# Patient Record
Sex: Female | Born: 1970 | Race: Black or African American | Hispanic: No | Marital: Single | Smoking: Current every day smoker
Health system: Southern US, Community
[De-identification: ages and names within clinical notes are randomized; demographics above are authoritative.]

## PROBLEM LIST (undated history)

## (undated) DIAGNOSIS — F329 Major depressive disorder, single episode, unspecified: Secondary | ICD-10-CM

## (undated) DIAGNOSIS — M51369 Other intervertebral disc degeneration, lumbar region without mention of lumbar back pain or lower extremity pain: Secondary | ICD-10-CM

## (undated) DIAGNOSIS — G4700 Insomnia, unspecified: Secondary | ICD-10-CM

## (undated) DIAGNOSIS — I10 Essential (primary) hypertension: Secondary | ICD-10-CM

## (undated) DIAGNOSIS — M1990 Unspecified osteoarthritis, unspecified site: Secondary | ICD-10-CM

## (undated) DIAGNOSIS — M419 Scoliosis, unspecified: Secondary | ICD-10-CM

## (undated) DIAGNOSIS — F419 Anxiety disorder, unspecified: Secondary | ICD-10-CM

## (undated) DIAGNOSIS — F259 Schizoaffective disorder, unspecified: Secondary | ICD-10-CM

## (undated) DIAGNOSIS — M5136 Other intervertebral disc degeneration, lumbar region: Secondary | ICD-10-CM

## (undated) DIAGNOSIS — M5126 Other intervertebral disc displacement, lumbar region: Secondary | ICD-10-CM

## (undated) DIAGNOSIS — G629 Polyneuropathy, unspecified: Secondary | ICD-10-CM

## (undated) DIAGNOSIS — F32A Depression, unspecified: Secondary | ICD-10-CM

## (undated) HISTORY — DX: Depression, unspecified: F32.A

## (undated) HISTORY — PX: EYE SURGERY: SHX253

## (undated) HISTORY — DX: Major depressive disorder, single episode, unspecified: F32.9

## (undated) HISTORY — DX: Anxiety disorder, unspecified: F41.9

## (undated) HISTORY — DX: Schizoaffective disorder, unspecified: F25.9

## (undated) HISTORY — DX: Insomnia, unspecified: G47.00

---

## 1990-07-31 HISTORY — PX: ECTOPIC PREGNANCY SURGERY: SHX613

## 1992-07-31 HISTORY — PX: OVARIAN CYST REMOVAL: SHX89

## 1996-07-31 HISTORY — PX: KNEE ARTHROSCOPY: SHX127

## 2007-08-01 HISTORY — PX: HERNIA REPAIR: SHX51

## 2007-12-08 ENCOUNTER — Emergency Department (HOSPITAL_COMMUNITY): Admission: EM | Admit: 2007-12-08 | Discharge: 2007-12-08 | Payer: Self-pay | Admitting: Emergency Medicine

## 2015-02-02 ENCOUNTER — Encounter: Payer: Self-pay | Admitting: Emergency Medicine

## 2015-02-02 ENCOUNTER — Emergency Department
Admission: EM | Admit: 2015-02-02 | Discharge: 2015-02-02 | Disposition: A | Discharge status: Home / Self Care | Payer: Self-pay | Attending: Emergency Medicine | Admitting: Emergency Medicine

## 2015-02-02 DIAGNOSIS — Z720 Tobacco use: Secondary | ICD-10-CM | POA: Insufficient documentation

## 2015-02-02 DIAGNOSIS — Z203 Contact with and (suspected) exposure to rabies: Secondary | ICD-10-CM | POA: Insufficient documentation

## 2015-02-02 DIAGNOSIS — Z79899 Other long term (current) drug therapy: Secondary | ICD-10-CM | POA: Insufficient documentation

## 2015-02-02 DIAGNOSIS — I10 Essential (primary) hypertension: Secondary | ICD-10-CM | POA: Insufficient documentation

## 2015-02-02 DIAGNOSIS — G8929 Other chronic pain: Secondary | ICD-10-CM | POA: Insufficient documentation

## 2015-02-02 DIAGNOSIS — Y9301 Activity, walking, marching and hiking: Secondary | ICD-10-CM | POA: Insufficient documentation

## 2015-02-02 DIAGNOSIS — Z760 Encounter for issue of repeat prescription: Secondary | ICD-10-CM | POA: Insufficient documentation

## 2015-02-02 DIAGNOSIS — W540XXA Bitten by dog, initial encounter: Secondary | ICD-10-CM | POA: Insufficient documentation

## 2015-02-02 DIAGNOSIS — S31154A Open bite of abdominal wall, left lower quadrant without penetration into peritoneal cavity, initial encounter: Secondary | ICD-10-CM | POA: Insufficient documentation

## 2015-02-02 DIAGNOSIS — Y92410 Unspecified street and highway as the place of occurrence of the external cause: Secondary | ICD-10-CM | POA: Insufficient documentation

## 2015-02-02 DIAGNOSIS — M545 Low back pain: Secondary | ICD-10-CM | POA: Insufficient documentation

## 2015-02-02 DIAGNOSIS — S2195XA Open bite of unspecified part of thorax, initial encounter: Secondary | ICD-10-CM

## 2015-02-02 DIAGNOSIS — Y998 Other external cause status: Secondary | ICD-10-CM | POA: Insufficient documentation

## 2015-02-02 HISTORY — DX: Other intervertebral disc displacement, lumbar region: M51.26

## 2015-02-02 HISTORY — DX: Essential (primary) hypertension: I10

## 2015-02-02 HISTORY — DX: Scoliosis, unspecified: M41.9

## 2015-02-02 HISTORY — DX: Other intervertebral disc degeneration, lumbar region: M51.36

## 2015-02-02 HISTORY — DX: Other intervertebral disc degeneration, lumbar region without mention of lumbar back pain or lower extremity pain: M51.369

## 2015-02-02 MED ORDER — AMOXICILLIN-POT CLAVULANATE 875-125 MG PO TABS
1.0000 | ORAL_TABLET | Freq: Once | ORAL | Status: AC
  Administered 2015-02-02: 1 via ORAL

## 2015-02-02 MED ORDER — ORPHENADRINE CITRATE 30 MG/ML IJ SOLN
60.0000 mg | INTRAMUSCULAR | Status: AC
  Administered 2015-02-02: 60 mg via INTRAMUSCULAR

## 2015-02-02 MED ORDER — KETOROLAC TROMETHAMINE 60 MG/2ML IM SOLN
INTRAMUSCULAR | Status: AC
  Filled 2015-02-02: qty 2

## 2015-02-02 MED ORDER — ORPHENADRINE CITRATE 30 MG/ML IJ SOLN
INTRAMUSCULAR | Status: AC
  Filled 2015-02-02: qty 2

## 2015-02-02 MED ORDER — AMOXICILLIN-POT CLAVULANATE 875-125 MG PO TABS
ORAL_TABLET | ORAL | Status: AC
  Filled 2015-02-02: qty 1

## 2015-02-02 MED ORDER — KETOROLAC TROMETHAMINE 60 MG/2ML IM SOLN
60.0000 mg | Freq: Once | INTRAMUSCULAR | Status: AC
Start: 2015-02-02 — End: 2015-02-02
  Administered 2015-02-02: 60 mg via INTRAMUSCULAR

## 2015-02-02 MED ORDER — AMOXICILLIN-POT CLAVULANATE 875-125 MG PO TABS: 1.0000 | ORAL_TABLET | Freq: Two times a day (BID) | ORAL | Status: DC

## 2015-02-02 MED ORDER — KETOROLAC TROMETHAMINE 10 MG PO TABS: 10.0000 mg | ORAL_TABLET | Freq: Three times a day (TID) | ORAL | Status: DC

## 2015-02-02 MED ORDER — ORPHENADRINE CITRATE ER 100 MG PO TB12: 100.0000 mg | ORAL_TABLET | Freq: Two times a day (BID) | ORAL | Status: DC | PRN

## 2015-02-02 NOTE — Discharge Instructions (Signed)
Chronic Back Pain  When back pain lasts longer than 3 months, it is called chronic back pain.People with chronic back pain often go through certain periods that are more intense (flare-ups).  CAUSES Chronic back pain can be caused by wear and tear (degeneration) on different structures in your back. These structures include:  The bones of your spine (vertebrae) and the joints surrounding your spinal cord and nerve roots (facets).  The strong, fibrous tissues that connect your vertebrae (ligaments). Degeneration of these structures may result in pressure on your nerves. This can lead to constant pain. HOME CARE INSTRUCTIONS  Avoid bending, heavy lifting, prolonged sitting, and activities which make the problem worse.  Take brief periods of rest throughout the day to reduce your pain. Lying down or standing usually is better than sitting while you are resting.  Take over-the-counter or prescription medicines only as directed by your caregiver. SEEK IMMEDIATE MEDICAL CARE IF:   You have weakness or numbness in one of your legs or feet.  You have trouble controlling your bladder or bowels.  You have nausea, vomiting, abdominal pain, shortness of breath, or fainting. Document Released: 08/24/2004 Document Revised: 10/09/2011 Document Reviewed: 07/01/2011 [facility] Patient Information 2015 [place]. This information is not intended to replace advice given to you by your health care provider. Make sure you discuss any questions you have with your health care provider.  Animal Bite An animal bite can result in a scratch on the skin, deep open cut, puncture of the skin, crush injury, or tearing away of the skin or a body part. Dogs are responsible for most animal bites. Children are bitten more often than adults. An animal bite can range from very mild to more serious. A small bite from your house pet is no cause for alarm. However, some animal bites can become infected or injure a bone or  other tissue. You must seek medical care if:  The skin is broken and bleeding does not slow down or stop after 15 minutes.  The puncture is deep and difficult to clean (such as a cat bite).  Pain, warmth, redness, or pus develops around the wound.  The bite is from a stray animal or rodent. There may be a risk of rabies infection.  The bite is from a snake, raccoon, skunk, fox, coyote, or bat. There may be a risk of rabies infection.  The person bitten has a chronic illness such as diabetes, liver disease, or cancer, or the person takes medicine that lowers the immune system.  There is concern about the location and severity of the bite. It is important to clean and protect an animal bite wound right away to prevent infection. Follow these steps:  Clean the wound with plenty of water and soap.  Apply an antibiotic cream.  Apply gentle pressure over the wound with a clean towel or gauze to slow or stop bleeding.  Elevate the affected area above the heart to help stop any bleeding.  Seek medical care. Getting medical care within 8 hours of the animal bite leads to the best possible outcome. DIAGNOSIS  Your caregiver will most likely:  Take a detailed history of the animal and the bite injury.  Perform a wound exam.  Take your medical history. Blood tests or X-rays may be performed. Sometimes, infected bite wounds are cultured and sent to a lab to identify the infectious bacteria.  TREATMENT  Medical treatment will depend on the location and type of animal bite as well as  the patient's medical history. Treatment may include:  Wound care, such as cleaning and flushing the wound with saline solution, bandaging, and elevating the affected area.  Antibiotics.  Tetanus immunization.  Rabies immunization.  Leaving the wound open to heal. This is often done with animal bites, due to the high risk of infection. However, in certain cases, wound closure with stitches, wound adhesive,  skin adhesive strips, or staples may be used. Infected bites that are left untreated may require intravenous (IV) antibiotics and surgical treatment in the hospital. HOME CARE INSTRUCTIONS  Follow your caregiver's instructions for wound care.  Take all medicines as directed.  If your caregiver prescribes antibiotics, take them as directed. Finish them even if you start to feel better.  Follow up with your caregiver for further exams or immunizations as directed. You may need a tetanus shot if:  You cannot remember when you had your last tetanus shot.  You have never had a tetanus shot.  The injury broke your skin. If you get a tetanus shot, your arm may swell, get red, and feel warm to the touch. This is common and not a problem. If you need a tetanus shot and you choose not to have one, there is a rare chance of getting tetanus. Sickness from tetanus can be serious. SEEK MEDICAL CARE IF:  You notice warmth, redness, soreness, swelling, pus discharge, or a bad smell coming from the wound.  You have a red line on the skin coming from the wound.  You have a fever, chills, or a general ill feeling.  You have nausea or vomiting.  You have continued or worsening pain.  You have trouble moving the injured part.  You have other questions or concerns. MAKE SURE YOU:  Understand these instructions.  Will watch your condition.  Will get help right away if you are not doing well or get worse. Document Released: 04/04/2011 Document Revised: 10/09/2011 Document Reviewed: 04/04/2011 [facility] Patient Information 2015 [place], [place]. This information is not intended to replace advice given to you by your health care provider. Make sure you discuss any questions you have with your health care provider.  Take the prescription meds as directed.  Keep the wound clean, dry, and covered.  Follow-up with your  Provider or [facility] as needed.  Rabies  Rabies is a viral infection that  can be spread to people from infected animals. The infection affects the brain and central nervous system. Once the disease develops, it almost always causes death. Because of this, when a person is bitten by an animal that may have rabies, treatment to prevent rabies often needs to be started whether or not the animal is known to be infected. Prompt treatment with the rabies vaccine and rabies immune globulin is very effective at preventing the infection from developing in people who have been exposed to the rabies virus. CAUSES  Rabies is caused by a virus that lives inside some animals. When a person is bitten by an infected animal, the rabies virus is spread to the person through the infected spit (saliva) of the animal. This virus can be carried by animals such as dogs, cats, skunks, bats, woodchucks, raccoons, coyotes, and foxes. SYMPTOMS  By the time symptoms appear, rabies is usually fatal for the person. Common symptoms include:  Headache.  Fever.  Fatigue and weakness.  Agitation.  Anxiety.  Confusion.  Unusual behavior, such as hyperactivity, fear of water (hydrophobia), or fear of air (aerophobia).  Hallucinations.  Insomnia.  Weakness in the arms or legs.  Difficulty swallowing. Most people get sick in 1-3 months after being bitten. This often varies and may depend on the location of the bite. The infection will take less time to develop if the bite occurred closer to the head.  DIAGNOSIS  To determine if a person is infected, several tests must be performed, such as:  A skin biopsy.  A saliva test.  A lumbar puncture to remove spinal fluid so it can be examined.  Blood tests. TREATMENT  Treatment to prevent the infection from developing (post-exposure prophylaxis, PEP) is often started before knowing for sure if the person has been exposed to the rabies virus. PEP involves cleaning the wound, giving an antibody injection (rabies immune globulin), and giving a  series of rabies vaccine injections. The series of injections are usually given over a two-week period. If possible, the animal that bit the person will be observed to see if it remains healthy. If the animal has been killed, it can be sent to a state laboratory and examined to see if the animal had rabies. If a person is bitten by a domestic animal (dog, cat, or ferret) that appears healthy and can be observed to see if it remains healthy, often no further treatment is necessary other than care of the wounds caused by the animal. Rabies is often a fatal illness once the infection develops in a person. Although a few people who developed rabies have survived after experimental treatment with certain drugs, all these survivors still had severe nervous system problems after the treatment. This is why caregivers use extra caution and begin PEP treatment for people who have been bitten by animals that are possibly infected with rabies.  HOME CARE INSTRUCTIONS  If you were bitten by an unknown animal, make sure you know your caregiver's instructions for follow-up. If the animal was sent to a laboratory for examination, ask when the test results will be ready. Make sure you get the test results.  Take these steps to care for your wound:  Keep the wound clean, dry, and dressed as directed by your caregiver.  Keep the injured part elevated as much as possible.  Do not resume use of the affected area until directed.  Only take over-the-counter or prescription medicines as directed by your caregiver.  Keep all follow-up appointments as directed by your caregiver. PREVENTION  To prevent rabies, people need to reduce their risk of having contact with infected animals.   Make sure your pets (dogs, cats, ferrets) are vaccinated against rabies. Keep these vaccinations up-to-date as directed by your veterinarian.  Supervise your pets when they are outside. Keep them away from wild animals.  Call your local  animal control services to report any stray animals. These animals may not be vaccinated.  Stay away from stray or wild animals.  Consider getting the rabies vaccine (preexposure) if you are traveling to an area where rabies is common or if your job or activities involve possible contact with wild or stray animals. Discuss this with your caregiver. Document Released: 07/17/2005 Document Revised: 04/10/2012 Document Reviewed: 02/13/2012 [facility] Patient Information 2015 [place], [place]. This information is not intended to replace advice given to you by your health care provider. Make sure you discuss any questions you have with your health care provider.  Follow-up with [facility] or [employer] for advise on rabies status of the stray dog.  Return to the ED in 7-10 days to start the series.  Take the antibiotic as directed.

## 2015-02-02 NOTE — ED Provider Notes (Signed)
[facility] Emergency Department Provider Note ____________________________________________  Time seen: 1545  I have reviewed the triage vital signs and the nursing notes.  HISTORY  Chief Complaint  Animal Bite and Back Pain  HPI Diamond Ballard is a 44 y.o. female to the ED for evaluation of a dog bite which was sustained on Saturday. In triage, she reported the dog involved, was known and vaccinated. In the interview, she claims that the dog was unknown, and that the bite occurred while while she waswalking on the street. She claims she bent over to pet this unknown, stray dog, when he bit her on her protuberant abdomen. She is up-to-date on her tetanus within the last 5 years. She is primarily concerned with her chronic low back pain, which was flared after she tripped walking up the stairs at home today while doing laundry. He is requesting prescription for her Percocet, which she claims she has not dosed and at least 2 months, due to relocating.  Past Medical History  Diagnosis Date  . Hypertension   . Scoliosis   . Bulging lumbar disc    There are no active problems to display for this patient.  No past surgical history on file.  Current Outpatient Rx  Name  Route  Sig  Dispense  Refill  . gabapentin (NEURONTIN) 300 MG capsule   Oral   Take 300 mg by mouth 3 (three) times daily.         Marland Kitchen. LORazepam (ATIVAN) 1 MG tablet   Oral   Take 1 mg by mouth every 8 (eight) hours.         . risperiDONE (RISPERDAL) 1 MG tablet   Oral   Take 1 mg by mouth at bedtime.         Marland Kitchen. venlafaxine (EFFEXOR) 50 MG tablet   Oral   Take 50 mg by mouth 2 (two) times daily.         Marland Kitchen. zolpidem (AMBIEN) 10 MG tablet   Oral   Take 10 mg by mouth at bedtime as needed for sleep.         Marland Kitchen. amoxicillin-clavulanate (AUGMENTIN) 875-125 MG per tablet   Oral   Take 1 tablet by mouth 2 (two) times daily.   20 tablet   0   . ketorolac (TORADOL) 10 MG tablet   Oral  Take 1 tablet (10 mg total) by mouth every 8 (eight) hours.   15 tablet   0   . orphenadrine (NORFLEX) 100 MG tablet   Oral   Take 1 tablet (100 mg total) by mouth 2 (two) times daily as needed for muscle spasms.   20 tablet   0     Allergies Trazodone and nefazodone  No family history on file.  Social History History  Substance Use Topics  . Smoking status: Current Every Day Smoker    Types: Cigarettes  . Smokeless tobacco: Not on file  . Alcohol Use: Yes   Review of Systems  Constitutional: Negative for fever. Eyes: Negative for visual changes. ENT: Negative for sore throat. Cardiovascular: Negative for chest pain. Respiratory: Negative for shortness of breath. Gastrointestinal: Negative for abdominal pain, vomiting and diarrhea. Genitourinary: Negative for dysuria. Musculoskeletal: Positive for back pain. Skin: Negative for rash. Dog bite at above. Neurological: Negative for headaches, focal weakness or numbness. ____________________________________________  PHYSICAL EXAM:  VITAL SIGNS: ED Triage Vitals  Enc Vitals Group     BP 02/02/15 1511 125/80 mmHg     Pulse  Rate 02/02/15 1511 110     Resp 02/02/15 1511 18     Temp 02/02/15 1511 99.2 F (37.3 C)     Temp Source 02/02/15 1511 Oral     SpO2 02/02/15 1511 96 %     Weight 02/02/15 1511 230 lb (104.327 kg)     Height 02/02/15 1511  (1.702 m)     Head Cir --      Peak Flow --      Pain Score 02/02/15 1513 10     Pain Loc --      Pain Edu? --      Excl. in GC? --    Constitutional: Alert and oriented. Well appearing and in no distress. Eyes: Conjunctivae are normal. PERRL. Normal extraocular movements. ENT   Head: Normocephalic and atraumatic.   Nose: No congestion/rhinnorhea.   Mouth/Throat: Mucous membranes are moist.   Neck: Supple. No thyromegaly. Hematological/Lymphatic/Immunilogical: No cervical lymphadenopathy. Cardiovascular: Normal rate, regular rhythm.  Respiratory:  Normal respiratory effort. No wheezes/rales/rhonchi. Gastrointestinal: Soft and nontender. No distention. Healing superficial scars, consistent with dog bite to the LLQ with local erythema and bruising.  Musculoskeletal: Nontender with normal range of motion in all extremities. Normal spinal alignment without spasm, deformity, or step-off.  Normal supine to sit to stand transition.   Neurologic:  Normal gait without ataxia. Normal speech and language. No gross focal neurologic deficits are appreciated. Normal LE DTRs bilaterally. Normal toe/heel raise. Negative SLR bilaterally.  Skin:  Skin is warm, dry and intact. No rash noted. Psychiatric: Mood and affect are normal. Patient exhibits appropriate insight and judgment.  PROCEDURES  Toradol 60 mg IM Norflex 60 mg IM Augmentin 875 mg PO ____________________________________________  INITIAL IMPRESSION / ASSESSMENT AND PLAN / ED COURSE  Superficial dog bite to the abdomen with local erythema. Chronic LBP with request for narcotic pain medicines. Patient advised she will not receive narcotics for her chronic pain, but will be given Toradol, Norflex, and Augmentin for her bite.   [place] PD notified of potential stray dog bite. Patient advised to follow-up regarding rabies status of dog. She is refusing rabies series at this time. Advised to return to the ED in 7-10 days for rabies vaccine and immune-globin. Rabies information given. ____________________________________________  FINAL CLINICAL IMPRESSION(S) / ED DIAGNOSES  Final diagnoses:  Dog bite of trunk, initial encounter  Chronic LBP     Lissa Hoard, PA-C 02/02/15 1632  Arnaldo Natal, MD 02/02/15 2232

## 2015-02-02 NOTE — ED Notes (Signed)
[facility]

## 2015-02-02 NOTE — ED Notes (Signed)
Pt states she was bit by her cousins dog on Sunday to abd., states dog has all of it's immunizations up to date, also states she fell this am while carrying laundry up stairs, states she has chronic lower back pain and fall made pain worse

## 2015-02-02 NOTE — ED Notes (Signed)
Pt has bruising and abrasions noted to LLQ of abd.

## 2015-07-21 ENCOUNTER — Emergency Department: Payer: Medicaid Other

## 2015-07-21 ENCOUNTER — Encounter: Payer: Self-pay | Admitting: Emergency Medicine

## 2015-07-21 ENCOUNTER — Emergency Department
Admission: EM | Admit: 2015-07-21 | Discharge: 2015-07-21 | Disposition: A | Discharge status: Home / Self Care | Payer: Medicaid Other | Attending: Emergency Medicine | Admitting: Emergency Medicine

## 2015-07-21 DIAGNOSIS — M25511 Pain in right shoulder: Secondary | ICD-10-CM | POA: Insufficient documentation

## 2015-07-21 DIAGNOSIS — H5710 Ocular pain, unspecified eye: Secondary | ICD-10-CM | POA: Insufficient documentation

## 2015-07-21 DIAGNOSIS — I10 Essential (primary) hypertension: Secondary | ICD-10-CM | POA: Diagnosis not present

## 2015-07-21 DIAGNOSIS — F17210 Nicotine dependence, cigarettes, uncomplicated: Secondary | ICD-10-CM | POA: Diagnosis not present

## 2015-07-21 DIAGNOSIS — Z3202 Encounter for pregnancy test, result negative: Secondary | ICD-10-CM | POA: Insufficient documentation

## 2015-07-21 DIAGNOSIS — Z760 Encounter for issue of repeat prescription: Secondary | ICD-10-CM | POA: Diagnosis not present

## 2015-07-21 DIAGNOSIS — G8929 Other chronic pain: Secondary | ICD-10-CM

## 2015-07-21 DIAGNOSIS — A599 Trichomoniasis, unspecified: Secondary | ICD-10-CM | POA: Diagnosis not present

## 2015-07-21 DIAGNOSIS — R51 Headache: Secondary | ICD-10-CM | POA: Diagnosis present

## 2015-07-21 DIAGNOSIS — M25512 Pain in left shoulder: Secondary | ICD-10-CM | POA: Insufficient documentation

## 2015-07-21 HISTORY — DX: Unspecified osteoarthritis, unspecified site: M19.90

## 2015-07-21 HISTORY — DX: Polyneuropathy, unspecified: G62.9

## 2015-07-21 LAB — CBC
HEMATOCRIT: 47.7 % — AB (ref 35.0–47.0)
HEMOGLOBIN: 15.4 g/dL (ref 12.0–16.0)
MCH: 28.4 pg (ref 26.0–34.0)
MCHC: 32.4 g/dL (ref 32.0–36.0)
MCV: 87.9 fL (ref 80.0–100.0)
Platelets: 299 10*3/uL (ref 150–440)
RBC: 5.43 MIL/uL — AB (ref 3.80–5.20)
RDW: 15.3 % — ABNORMAL HIGH (ref 11.5–14.5)
WBC: 7.9 10*3/uL (ref 3.6–11.0)

## 2015-07-21 LAB — URINALYSIS COMPLETE WITH MICROSCOPIC (ARMC ONLY)
BILIRUBIN URINE: NEGATIVE
GLUCOSE, UA: NEGATIVE mg/dL
HGB URINE DIPSTICK: NEGATIVE
KETONES UR: NEGATIVE mg/dL
Nitrite: NEGATIVE
PH: 5 (ref 5.0–8.0)
Protein, ur: NEGATIVE mg/dL
SPECIFIC GRAVITY, URINE: 1.016 (ref 1.005–1.030)

## 2015-07-21 LAB — POCT PREGNANCY, URINE: PREG TEST UR: NEGATIVE

## 2015-07-21 LAB — BASIC METABOLIC PANEL
ANION GAP: 7 (ref 5–15)
BUN: 11 mg/dL (ref 6–20)
CO2: 24 mmol/L (ref 22–32)
Calcium: 9.3 mg/dL (ref 8.9–10.3)
Chloride: 106 mmol/L (ref 101–111)
Creatinine, Ser: 0.69 mg/dL (ref 0.44–1.00)
GLUCOSE: 81 mg/dL (ref 65–99)
POTASSIUM: 4 mmol/L (ref 3.5–5.1)
Sodium: 137 mmol/L (ref 135–145)

## 2015-07-21 LAB — TROPONIN I

## 2015-07-21 MED ORDER — LIDOCAINE HCL (PF) 1 % IJ SOLN
INTRAMUSCULAR | Status: AC
  Filled 2015-07-21: qty 5

## 2015-07-21 MED ORDER — CEFTRIAXONE SODIUM 250 MG IJ SOLR
250.0000 mg | INTRAMUSCULAR | Status: DC
  Administered 2015-07-21: 250 mg via INTRAMUSCULAR
  Filled 2015-07-21: qty 250

## 2015-07-21 MED ORDER — METRONIDAZOLE 500 MG PO TABS
500.0000 mg | ORAL_TABLET | Freq: Once | ORAL | Status: AC
  Administered 2015-07-21: 500 mg via ORAL
  Filled 2015-07-21: qty 1

## 2015-07-21 MED ORDER — AMLODIPINE BESYLATE 10 MG PO TABS: 10.0000 mg | ORAL_TABLET | Freq: Every day | ORAL | Status: DC

## 2015-07-21 MED ORDER — AMLODIPINE BESYLATE 5 MG PO TABS
10.0000 mg | ORAL_TABLET | Freq: Once | ORAL | Status: AC
  Administered 2015-07-21: 10 mg via ORAL
  Filled 2015-07-21: qty 2

## 2015-07-21 MED ORDER — ALPRAZOLAM 0.5 MG PO TABS
1.0000 mg | ORAL_TABLET | Freq: Once | ORAL | Status: AC
  Administered 2015-07-21: 1 mg via ORAL
  Filled 2015-07-21: qty 2

## 2015-07-21 MED ORDER — AZITHROMYCIN 250 MG PO TABS
1000.0000 mg | ORAL_TABLET | Freq: Once | ORAL | Status: AC
  Administered 2015-07-21: 1000 mg via ORAL
  Filled 2015-07-21: qty 4

## 2015-07-21 MED ORDER — METRONIDAZOLE 500 MG PO TABS: 500.0000 mg | ORAL_TABLET | Freq: Two times a day (BID) | ORAL | Status: DC

## 2015-07-21 NOTE — ED Provider Notes (Signed)
[facility] Emergency Department Provider Note     Time seen: ----------------------------------------- 11:31 AM on 07/21/2015 -----------------------------------------    I have reviewed the triage vital signs and the nursing notes.   HISTORY  Chief Complaint Headache; Hypertension; Shoulder Pain; and Chest Pain    HPI Diamond Ballard is a 44 y.o. female who presents ER for high blood pressure, [place]. She has not established herself with a primary care doctor here while she is in [place]. She does have headaches, high blood pressure, eye pain and bilateral shoulder pain. Patient states she was in chronic pain management where she used to live, has not any pain medicine or her Xanax in quite some time.   Past Medical History  Diagnosis Date  . Hypertension   . Scoliosis   . Bulging lumbar disc   . Arthritis   . Neuropathy (HCC)     There are no active problems to display for this patient.   Past Surgical History  Procedure Laterality Date  . Ectopic pregnancy surgery    . Knee arthroscopy    . Eye surgery      Allergies Trazodone and nefazodone  Social History Social History  Substance Use Topics  . Smoking status: Current Every Day Smoker -- 0.50 packs/day    Types: Cigarettes  . Smokeless tobacco: None  . Alcohol Use: Yes     Comment: rarely    Review of Systems Constitutional: Negative for fever. Eyes: Negative for visual changes. ENT: Negative for sore throat. Cardiovascular: Negative for chest pain. Respiratory: Negative for shortness of breath. Gastrointestinal: Negative for abdominal pain, vomiting and diarrhea. Genitourinary: Negative for dysuria. Musculoskeletal: Positive for bilateral shoulder pain Skin: Negative for rash. Neurological: Positive for headaches  10-point ROS otherwise negative.  ____________________________________________   PHYSICAL EXAM:  VITAL SIGNS: ED  Triage Vitals  Enc Vitals Group     BP 07/21/15 1042 141/106 mmHg     Pulse Rate 07/21/15 1042 74     Resp 07/21/15 1042 20     Temp 07/21/15 1042 98.6 F (37 C)     Temp Source 07/21/15 1042 Oral     SpO2 07/21/15 1042 98 %     Weight 07/21/15 1042 240 lb (108.863 kg)     Height 07/21/15 1042  (1.702 m)     Head Cir --      Peak Flow --      Pain Score 07/21/15 1043 9     Pain Loc --      Pain Edu? --      Excl. in GC? --     Constitutional: Alert and oriented. Well appearing and in no distress. Eyes: Conjunctivae are normal. PERRL. Normal extraocular movements. ENT   Head: Normocephalic and atraumatic.   Nose: No congestion/rhinnorhea.   Mouth/Throat: Mucous membranes are moist.   Neck: No stridor. Cardiovascular: Normal rate, regular rhythm. Normal and symmetric distal pulses are present in all extremities. No murmurs, rubs, or gallops. Respiratory: Normal respiratory effort without tachypnea nor retractions. Breath sounds are clear and equal bilaterally. No wheezes/rales/rhonchi. Gastrointestinal: Soft and nontender. No distention. No abdominal bruits.  Musculoskeletal: Nontender with normal range of motion in all extremities. No joint effusions.  No lower extremity tenderness nor edema. Neurologic:  Normal speech and language. No gross focal neurologic deficits are appreciated. Speech is normal.  Skin:  Skin is warm, dry and intact. No rash noted. Psychiatric: Mood and affect are normal. Speech and  behavior are normal. Patient exhibits appropriate insight and judgment. ____________________________________________  EKG: Interpreted by me. Normal sinus rhythm with a rate of 75 bpm, normal PR interval, normal QS with, normal QT interval.  ____________________________________________  ED COURSE:  Pertinent labs & imaging results that were available during my care of the patient were reviewed by me and considered in my medical decision making (see chart for  details). Patient is in no acute distress, has run out of her medication and seeks refill. I have advised that we do not refill narcotics or anxiolytic medication. She'll be restarted on Norvasc and given a dose of oral Xanax here. ____________________________________________    LABS (pertinent positives/negatives)  Labs Reviewed  CBC - Abnormal; Notable for the following:    RBC 5.43 (*)    HCT 47.7 (*)    RDW 15.3 (*)    All other components within normal limits  URINALYSIS COMPLETEWITH MICROSCOPIC ([facility] ONLY) - Abnormal; Notable for the following:    Color, Urine YELLOW (*)    APPearance CLOUDY (*)    Leukocytes, UA 1+ (*)    Bacteria, UA RARE (*)    Squamous Epithelial / LPF TOO NUMEROUS TO COUNT (*)    All other components within normal limits  BASIC METABOLIC PANEL  TROPONIN I  POCT PREGNANCY, URINE  POC URINE PREG, ED  ___________________________________________  FINAL ASSESSMENT AND PLAN  Headache, high blood pressure, medication refill, chronic pain, trichomoniasis  Plan: Patient with labs and imaging as dictated above. Patient be covered for gonorrhea and chlamydia with Rocephin and Zithromax respectively. Trichomonas be treated with Flagyl twice daily. She'll be restarted on Norvasc and [facility] is [facility].   Emily FilbertWilliams, Taiyana Kissler E, MD   Emily FilbertJonathan E Gaspare Netzel, MD 07/21/15 [phone]

## 2015-07-21 NOTE — ED Notes (Signed)
Patient states that she has a hx/o high blood pressure, she [place] from [place] and has not been able to get established with a PCP and she ran out of her blood pressure meds in November.  Patient is c/o Headaches, High BP, eye pain, and pain in both shoulders.

## 2015-07-21 NOTE — Discharge Instructions (Signed)
Chronic Pain  Chronic pain can be defined as pain that is off and on and lasts for 3-6 months or longer. Many things cause chronic pain, which can make it difficult to make a diagnosis. There are many treatment options available for chronic pain. However, finding a treatment that works well for you may require trying various approaches until the right one is found. Many people benefit from a combination of two or more types of treatment to control their pain.  SYMPTOMS   Chronic pain can occur anywhere in the body and can range from mild to very severe. Some types of chronic pain include:  · Headache.  · Low back pain.  · Cancer pain.  · Arthritis pain.  · Neurogenic pain. This is pain resulting from damage to nerves.   People with chronic pain may also have other symptoms such as:  · Depression.  · Anger.  · Insomnia.  · Anxiety.  DIAGNOSIS   Your health care provider will help diagnose your condition over time. In many cases, the initial focus will be on excluding possible conditions that could be causing the pain. Depending on your symptoms, your health care provider may order tests to diagnose your condition. Some of these tests may include:   · Blood tests.    · CT scan.    · MRI.    · X-rays.    · Ultrasounds.    · Nerve conduction studies.    You may need to see a specialist.   TREATMENT   Finding treatment that works well may take time. You may be referred to a pain specialist. He or she may prescribe medicine or therapies, such as:   · Mindful meditation or yoga.  · Shots (injections) of numbing or pain-relieving medicines into the spine or area of pain.  · Local electrical stimulation.  · Acupuncture.    · Massage therapy.    · Aroma, color, light, or sound therapy.    · Biofeedback.    · Working with a physical therapist to keep from getting stiff.    · Regular, gentle exercise.    · Cognitive or behavioral therapy.    · Group support.    Sometimes, surgery may be recommended.   HOME CARE INSTRUCTIONS    · Take all medicines as directed by your health care provider.    · Lessen stress in your life by relaxing and doing things such as listening to calming music.    · Exercise or be active as directed by your health care provider.    · Eat a healthy diet and include things such as vegetables, fruits, fish, and lean meats in your diet.    · Keep all follow-up appointments with your health care provider.    · Attend a support group with others suffering from chronic pain.  SEEK MEDICAL CARE IF:   · Your pain gets worse.    · You develop a new pain that was not there before.    · You cannot tolerate medicines given to you by your health care provider.    · You have new symptoms since your last visit with your health care provider.    SEEK IMMEDIATE MEDICAL CARE IF:   · You feel weak.    · You have decreased sensation or numbness.    · You lose control of bowel or bladder function.    · Your pain suddenly gets much worse.    · You develop shaking.  · You develop chills.  · You develop confusion.  · You develop chest pain.  · You develop shortness of breath.    MAKE SURE YOU:  ·   Document Revised: 03/19/2013 Document Reviewed: 01/10/2013 Elsevier Interactive Patient Education 2016 [employer].  Hypertension Hypertension is another name for high blood pressure. High blood pressure forces your heart to work harder to pump blood. A blood pressure reading has two numbers, which includes a higher number over a lower number (example: 110/72). HOME CARE   Have your blood pressure rechecked by your doctor.  Only take medicine as told by your doctor. Follow the directions carefully. The  medicine does not work as well if you skip doses. Skipping doses also puts you at risk for problems.  Do not smoke.  Monitor your blood pressure at home as told by your doctor. GET HELP IF:  You think you are having a reaction to the medicine you are taking.  You have repeat headaches or feel dizzy.  You have puffiness (swelling) in your ankles.  You have trouble with your vision. GET HELP RIGHT AWAY IF:   You get a very bad headache and are confused.  You feel weak, numb, or faint.  You get chest or belly (abdominal) pain.  You throw up (vomit).  You cannot breathe very well. MAKE SURE YOU:   Understand these instructions.  Will watch your condition.  Will get help right away if you are not doing well or get worse.   This information is not intended to replace advice given to you by your health care provider. Make sure you discuss any questions you have with your health care provider.   Document Released: 01/03/2008 Document Revised: 07/22/2013 Document Reviewed: 05/09/2013 Elsevier Interactive Patient Education 2016 [employer].  Trichomoniasis Trichomoniasis is an infection caused by an organism called Trichomonas. The infection can affect both women and men. In women, the outer female genitalia and the vagina are affected. In men, the penis is mainly affected, but the prostate and other reproductive organs can also be involved. Trichomoniasis is a sexually transmitted infection (STI) and is most often passed to another person through sexual contact.  RISK FACTORS  Having unprotected sexual intercourse.  Having sexual intercourse with an infected partner. SIGNS AND SYMPTOMS  Symptoms of trichomoniasis in women include:  Abnormal gray-green frothy vaginal discharge.  Itching and irritation of the vagina.  Itching and irritation of the area outside the vagina. Symptoms of trichomoniasis in men include:   Penile discharge with or without pain.  Pain during  urination. This results from inflammation of the urethra. DIAGNOSIS  Trichomoniasis may be found during a Pap test or physical exam. Your health care provider may use one of the following methods to help diagnose this infection:  Testing the pH of the vagina with a test tape.  Using a vaginal swab test that checks for the Trichomonas organism. A test is available that provides results within a few minutes.  Examining a urine sample.  Testing vaginal secretions. Your health care provider may test you for other STIs, including HIV. TREATMENT   You may be given medicine to fight the infection. Women should inform their health care provider if they could be or are pregnant. Some medicines used to treat the infection should not be taken during pregnancy.  Your health care provider may recommend over-the-counter medicines or creams to decrease itching or irritation.  Your sexual partner will need to be treated if infected.  Your health care provider may test you for infection again 3 months after treatment. HOME CARE INSTRUCTIONS   Take medicines only as directed by your health care provider.  Take over-the-counter medicine  for itching or irritation as directed by your health care provider.  Do not have sexual intercourse while you have the infection.  Women should not douche or wear tampons while they have the infection.  Discuss your infection with your partner. Your partner may have gotten the infection from you, or you may have gotten it from your partner.  Have your sex partner get examined and treated if necessary.  Practice safe, informed, and protected sex.  See your health care provider for other STI testing. SEEK MEDICAL CARE IF:   You still have symptoms after you finish your medicine.  You develop abdominal pain.  You have pain when you urinate.  You have bleeding after sexual intercourse.  You develop a rash.  Your medicine makes you sick or makes you throw up  (vomit). MAKE SURE YOU:  Understand these instructions.  Will watch your condition.  Will get help right away if you are not doing well or get worse.   This information is not intended to replace advice given to you by your health care provider. Make sure you discuss any questions you have with your health care provider.   Document Released: 01/10/2001 Document Revised: 08/07/2014 Document Reviewed: 04/28/2013 Elsevier Interactive Patient Education [employer]vier Inc.

## 2015-07-21 NOTE — ED Notes (Signed)
Patient to ED today with headache, high blood pressure and bilateral shoulder pain. Symptoms have been occurring for approx 2.5 weeks.  Pt states she just moved here from [place] with no insurance and has not taken BP meds since 06/29/15.

## 2015-07-21 NOTE — ED Notes (Signed)
Pt now stating that having a headache and high blood pressure is making her feel anxious and that her chest began to feel pressure and "squeezing" this morning at 05:20.  Other symptoms included shortness of breath and dizziness.

## 2015-07-31 ENCOUNTER — Encounter (HOSPITAL_COMMUNITY): Payer: Self-pay | Admitting: Emergency Medicine

## 2015-07-31 ENCOUNTER — Emergency Department (HOSPITAL_COMMUNITY)
Admission: EM | Admit: 2015-07-31 | Discharge: 2015-07-31 | Disposition: A | Discharge status: Home / Self Care | Payer: Medicaid Other | Source: Home / Self Care | Attending: Family Medicine | Admitting: Family Medicine

## 2015-07-31 DIAGNOSIS — M797 Fibromyalgia: Secondary | ICD-10-CM

## 2015-07-31 DIAGNOSIS — G8929 Other chronic pain: Secondary | ICD-10-CM | POA: Diagnosis not present

## 2015-07-31 DIAGNOSIS — M609 Myositis, unspecified: Secondary | ICD-10-CM

## 2015-07-31 DIAGNOSIS — M7918 Myalgia, other site: Secondary | ICD-10-CM

## 2015-07-31 MED ORDER — PREDNISONE 20 MG PO TABS: ORAL_TABLET | ORAL | Status: DC

## 2015-07-31 NOTE — Discharge Instructions (Signed)
Chronic Pain  Chronic pain can be defined as pain that is off and on and lasts for 3-6 months or longer. Many things cause chronic pain, which can make it difficult to make a diagnosis. There are many treatment options available for chronic pain. However, finding a treatment that works well for you may require trying various approaches until the right one is found. Many people benefit from a combination of two or more types of treatment to control their pain.  SYMPTOMS   Chronic pain can occur anywhere in the body and can range from mild to very severe. Some types of chronic pain include:  · Headache.  · Low back pain.  · Cancer pain.  · Arthritis pain.  · Neurogenic pain. This is pain resulting from damage to nerves.   People with chronic pain may also have other symptoms such as:  · Depression.  · Anger.  · Insomnia.  · Anxiety.  DIAGNOSIS   Your health care provider will help diagnose your condition over time. In many cases, the initial focus will be on excluding possible conditions that could be causing the pain. Depending on your symptoms, your health care provider may order tests to diagnose your condition. Some of these tests may include:   · Blood tests.    · CT scan.    · MRI.    · X-rays.    · Ultrasounds.    · Nerve conduction studies.    You may need to see a specialist.   TREATMENT   Finding treatment that works well may take time. You may be referred to a pain specialist. He or she may prescribe medicine or therapies, such as:   · Mindful meditation or yoga.  · Shots (injections) of numbing or pain-relieving medicines into the spine or area of pain.  · Local electrical stimulation.  · Acupuncture.    · Massage therapy.    · Aroma, color, light, or sound therapy.    · Biofeedback.    · Working with a physical therapist to keep from getting stiff.    · Regular, gentle exercise.    · Cognitive or behavioral therapy.    · Group support.    Sometimes, surgery may be recommended.   HOME CARE INSTRUCTIONS    · Take all medicines as directed by your health care provider.    · Lessen stress in your life by relaxing and doing things such as listening to calming music.    · Exercise or be active as directed by your health care provider.    · Eat a healthy diet and include things such as vegetables, fruits, fish, and lean meats in your diet.    · Keep all follow-up appointments with your health care provider.    · Attend a support group with others suffering from chronic pain.  SEEK MEDICAL CARE IF:   · Your pain gets worse.    · You develop a new pain that was not there before.    · You cannot tolerate medicines given to you by your health care provider.    · You have new symptoms since your last visit with your health care provider.    SEEK IMMEDIATE MEDICAL CARE IF:   · You feel weak.    · You have decreased sensation or numbness.    · You lose control of bowel or bladder function.    · Your pain suddenly gets much worse.    · You develop shaking.  · You develop chills.  · You develop confusion.  · You develop chest pain.  · You develop shortness of breath.    MAKE SURE YOU:  ·   Document Revised: 03/19/2013 Document Reviewed: 01/10/2013 Elsevier Interactive Patient Education 2016 Elsevier Inc.  Musculoskeletal Pain Musculoskeletal pain is muscle and boney aches and pains. These pains can occur in any part of the body. Your caregiver may treat you without knowing the cause of the pain. They may treat you if blood or urine tests, X-rays, and other tests were normal.  CAUSES There is often not a definite cause or reason for these pains. These pains may be caused by a type of germ  (virus). The discomfort may also come from overuse. Overuse includes working out too hard when your body is not fit. Boney aches also come from weather changes. Bone is sensitive to atmospheric pressure changes. HOME CARE INSTRUCTIONS   Ask when your test results will be ready. Make sure you get your test results.  Only take over-the-counter or prescription medicines for pain, discomfort, or fever as directed by your caregiver. If you were given medications for your condition, do not drive, operate machinery or power tools, or sign legal documents for 24 hours. Do not drink alcohol. Do not take sleeping pills or other medications that may interfere with treatment.  Continue all activities unless the activities cause more pain. When the pain lessens, slowly resume normal activities. Gradually increase the intensity and duration of the activities or exercise.  During periods of severe pain, bed rest may be helpful. Lay or sit in any position that is comfortable.  Putting ice on the injured area.  Put ice in a bag.  Place a towel between your skin and the bag.  Leave the ice on for 15 to 20 minutes, 3 to 4 times a day.  Follow up with your caregiver for continued problems and no reason can be found for the pain. If the pain becomes worse or does not go away, it may be necessary to repeat tests or do additional testing. Your caregiver may need to look further for a possible cause. SEEK IMMEDIATE MEDICAL CARE IF:  You have pain that is getting worse and is not relieved by medications.  You develop chest pain that is associated with shortness or breath, sweating, feeling sick to your stomach (nauseous), or throw up (vomit).  Your pain becomes localized to the abdomen.  You develop any new symptoms that seem different or that concern you. MAKE SURE YOU:   Understand these instructions.  Will watch your condition.  Will get help right away if you are not doing well or get worse.   This  information is not intended to replace advice given to you by your health care provider. Make sure you discuss any questions you have with your health care provider.   Document Released: 07/17/2005 Document Revised: 10/09/2011 Document Reviewed: 03/21/2013 Elsevier Interactive Patient Education 2016 [employer] Inc.  Myofascial Pain Syndrome and Fibromyalgia Myofascial pain syndrome and fibromyalgia are both pain disorders. This pain may be felt mainly in your muscles.   Myofascial pain syndrome:  Always has trigger points or tender points in the muscle that will cause pain when pressed. The pain may come and go.  Usually affects your neck, upper back, and shoulder areas. The pain often radiates into your arms and hands.  Fibromyalgia:  Has muscle pains and tenderness that come and go.  Is often associated with fatigue and sleep disturbances.  Has trigger points.  Tends to be long-lasting (chronic), but is not life-threatening. Fibromyalgia and myofascial pain are not the same. However, they often occur together. If you have both  conditions, each can make the other worse. Both are common and can cause enough pain and fatigue to make day-to-day activities difficult.  CAUSES  The exact causes of fibromyalgia and myofascial pain are not known. People with certain gene types may be more likely to develop fibromyalgia. Some factors can be triggers for both conditions, such as:   Spine disorders.  Arthritis.  Severe injury (trauma) and other physical stressors.  Being under a lot of stress.  A medical illness. SIGNS AND SYMPTOMS  Fibromyalgia The main symptom of fibromyalgia is widespread pain and tenderness in your muscles. This can vary over time. Pain is sometimes described as stabbing, shooting, or burning. You may have tingling or numbness, too. You may also have sleep problems and fatigue. You may wake up feeling tired and groggy (fibro fog). Other symptoms may include:   Bowel  and bladder problems.  Headaches.  Visual problems.  Problems with odors and noises.  Depression or mood changes.  Painful menstrual periods (dysmenorrhea).  Dry skin or eyes. Myofascial pain syndrome Symptoms of myofascial pain syndrome include:   Tight, ropy bands of muscle.   Uncomfortable sensations in muscular areas, such as:  Aching.  Cramping.  Burning.  Numbness.  Tingling.   Muscle weakness.  Trouble moving certain muscles freely (range of motion). DIAGNOSIS  There are no specific tests to diagnose fibromyalgia or myofascial pain syndrome. Both can be hard to diagnose because their symptoms are common in many other conditions. Your health care provider may suspect one or both of these conditions based on your symptoms and medical history. Your health care provider will also do a physical exam.  The key to diagnosing fibromyalgia is having pain, fatigue, and other symptoms for more than three months that cannot be explained by another condition.  The key to diagnosing myofascial pain syndrome is finding trigger points in muscles that are tender and cause pain elsewhere in your body (referred pain). TREATMENT  Treating fibromyalgia and myofascial pain often requires a team of health care providers. This usually starts with your primary provider and a physical therapist. You may also find it helpful to work with alternative health care providers, such as massage therapists or acupuncturists. Treatment for fibromyalgia may include medicines. This may include nonsteroidal anti-inflammatory drugs (NSAIDs), along with other medicines.  Treatment for myofascial pain may also include:  NSAIDs.  Cooling and stretching of muscles.  Trigger point injections.  Sound wave (ultrasound) treatments to stimulate muscles. HOME CARE INSTRUCTIONS   Take medicines only as directed by your health care provider.  Exercise as directed by your health care provider or physical  therapist.  Try to avoid stressful situations.  Practice relaxation techniques to control your stress. You may want to try:  Biofeedback.  Visual imagery.  Hypnosis.  Muscle relaxation.  Yoga.  Meditation.  Talk to your health care provider about alternative treatments, such as acupuncture or massage treatment.  Maintain a healthy lifestyle. This includes eating a healthy diet and getting enough sleep.  Consider joining a support group.  Do not do activities that stress or strain your muscles. That includes repetitive motions and heavy lifting. SEEK MEDICAL CARE IF:   You have new symptoms.  Your symptoms get worse.  You have side effects from your medicines.  You have trouble sleeping.  Your condition is causing depression or anxiety. FOR MORE INFORMATION   National Fibromyalgia Association: http://www.fmaware.orgwww.fmaware.org  Arthritis Foundation: http://www.arthritis.orgwww.arthritis.org  American Chronic Pain Association: [email or web address].comhttp://www.theacpa.org/condition/[email or web address]-[email or web address]-pain   This information is  not intended to replace advice given to you by your health care provider. Make sure you discuss any questions you have with your health care provider.   Document Released: 07/17/2005 Document Revised: 08/07/2014 Document Reviewed: 04/22/2014 Elsevier Interactive Patient Education [employer]

## 2015-07-31 NOTE — ED Notes (Signed)
Here with bilat shoulder pain with worsening to the R x 3 months intermit Pain with head,neck movement Tried Tramadol, pain patch without relief Denies strain or injury

## 2015-07-31 NOTE — ED Provider Notes (Signed)
CSN: [id number]     Arrival date & time 07/31/15  1747 History   First MD Initiated Contact with Patient 07/31/15 1848     Chief Complaint  Patient presents with  . Shoulder Pain   (Consider location/radiation/quality/duration/timing/severity/associated sxs/prior Treatment) HPI Comments: 44 year old female complaining of chronic bilateral shoulder pain for 4 months. She points to several areas of the bilateral shoulders as sources of pain. She states abduction of the arms increases pain and is unable to fully abduct them. She saw a PCP in September in [place]. She does not offer any treatment given her except that she was to be referred to an orthopedist. Denies injury. She states she is disabled due to a mental disorder. Denies any known injury or trauma to her shoulders. She has tried tramadol and Excedrin and these do not work for her. Chart review's showed that she was seen in the emergency department at [facility] for this complaint and others on December 21. She was instructed that they did not treat chronic pain or anxiety with narcotics or anxiolytics. She denied having seen anyone for this complaint in the past 4 months.   Past Medical History  Diagnosis Date  . Hypertension   . Scoliosis   . Bulging lumbar disc   . Arthritis   . Neuropathy [facility])    Past Surgical History  Procedure Laterality Date  . Ectopic pregnancy surgery    . Knee arthroscopy    . Eye surgery     No family history on file. Social History  Substance Use Topics  . Smoking status: Current Every Day Smoker -- 0.50 packs/day    Types: Cigarettes  . Smokeless tobacco: None  . Alcohol Use: Yes     Comment: rarely   OB History    No data available     Review of Systems  Constitutional: Positive for activity change. Negative for fever.  HENT: Negative.   Respiratory: Negative.   Cardiovascular: Negative.   Musculoskeletal: Positive for myalgias and neck pain.  Skin: Negative.    Neurological: Negative.   Psychiatric/Behavioral: Negative.     Allergies  Trazodone and nefazodone  Home Medications   Prior to Admission medications   Medication Sig Start Date End Date Taking? Authorizing Provider  alprazolam Prudy Feeler) 2 MG tablet Take 2 mg by mouth 2 (two) times daily.    Historical Provider, MD  amLODipine (NORVASC) 10 MG tablet Take 10 mg by mouth daily.    Historical Provider, MD  amLODipine (NORVASC) 10 MG tablet Take 1 tablet (10 mg total) by mouth daily. 07/21/15 07/20/16  Emily Filbert, MD  metroNIDAZOLE (FLAGYL) 500 MG tablet Take 1 tablet (500 mg total) by mouth 2 (two) times daily. 07/21/15   Emily Filbert, MD  predniSONE (DELTASONE) 20 MG tablet 3 Tabs PO Days 1-3, then 2 tabs PO Days 4-6, then 1 tab PO Day 7-9, then Half Tab PO Day 10-12 07/31/15   Hayden Rasmussen, NP  pregabalin (LYRICA) 100 MG capsule Take 100 mg by mouth 2 (two) times daily.    Historical Provider, MD  risperiDONE (RISPERDAL) 2 MG tablet Take 2 mg by mouth 2 (two) times daily.    Historical Provider, MD  Venlafaxine HCl 225 MG TB24 Take 225 mg by mouth at bedtime.    Historical Provider, MD  zolpidem (AMBIEN) 10 MG tablet Take 10 mg by mouth at bedtime as needed for sleep.    Historical Provider, MD   Meds Ordered and Administered this  Visit  Medications - No data to display  BP 103/67 mmHg  Pulse 106  Temp(Src) 98.3 F (36.8 C) (Oral)  SpO2 86%  LMP 07/11/2015 No data found.   Physical Exam  Constitutional: She is oriented to person, place, and time. She appears well-developed and well-nourished. No distress.  Eyes: EOM are normal.  Neck: Normal range of motion. Neck supple.  Cardiovascular: Normal rate.   Pulmonary/Chest: Effort normal. No respiratory distress.  Musculoskeletal: She exhibits no edema.  There is tenderness to light palpation of the bilateral trapezii, supraspinatus, deltoid musculature. Attempts to shrug shoulders produces pain. She refuses to  attempt any abduction due to pain. Distal neurovascular motor sensory of the upper extremities are intact. There is no shoulder asymmetry. No shoulder joint line tenderness. No palpable crepitus with passive range of motion attempts.  Neurological: She is alert and oriented to person, place, and time. She exhibits normal muscle tone.  Skin: Skin is warm and dry.  Psychiatric: She has a normal mood and affect.  Nursing note and vitals reviewed.   ED Course  Procedures (including critical care time)  Labs Review Labs Reviewed - No data to display  Imaging Review No results found.   Visual Acuity Review  Right Eye Distance:   Left Eye Distance:   Bilateral Distance:    Right Eye Near:   Left Eye Near:    Bilateral Near:         MDM   1. Chronic pain   2. Myofasciitis   3. Myofacial muscle pain    Prednisone taper dose Heat Must see PCP ASAP    Hayden Rasmussenavid Esbeidy Mclaine, NP 07/31/15 1930

## 2015-08-31 ENCOUNTER — Ambulatory Visit: Payer: Self-pay | Admitting: Family Medicine

## 2015-09-13 ENCOUNTER — Ambulatory Visit
Admission: RE | Admit: 2015-09-13 | Discharge: 2015-09-13 | Disposition: A | Discharge status: Home / Self Care | Payer: Medicaid Other | Source: Ambulatory Visit | Attending: Family Medicine | Admitting: Family Medicine

## 2015-09-13 ENCOUNTER — Ambulatory Visit (INDEPENDENT_AMBULATORY_CARE_PROVIDER_SITE_OTHER): Payer: Medicaid Other | Admitting: Family Medicine

## 2015-09-13 ENCOUNTER — Encounter: Payer: Self-pay | Admitting: Family Medicine

## 2015-09-13 VITALS — BP 108/77 | HR 100 | Temp 99.8°F | Resp 20 | Ht 67.0 in | Wt 253.2 lb

## 2015-09-13 DIAGNOSIS — G8929 Other chronic pain: Secondary | ICD-10-CM | POA: Insufficient documentation

## 2015-09-13 DIAGNOSIS — M25511 Pain in right shoulder: Secondary | ICD-10-CM | POA: Insufficient documentation

## 2015-09-13 MED ORDER — OXYCODONE HCL 5 MG PO TABS: 5.0000 mg | ORAL_TABLET | Freq: Three times a day (TID) | ORAL | Status: DC | PRN

## 2015-09-13 NOTE — Progress Notes (Signed)
Name: Diamond Ballard   MRN: [id number]    DOB: [birth year]   Date:09/13/2015       Progress Note  Subjective  Chief Complaint  Chief Complaint  Patient presents with  . Referral    Pain Management    Shoulder Pain  The pain is present in the right shoulder and right arm. This is a chronic problem. Episode onset: over 6 months. There has been no history of extremity trauma. The problem occurs daily. The quality of the pain is described as aching, sharp and pounding. The pain is at a severity of 10/10 ('most of time its a 10'). The pain is severe. Associated symptoms include stiffness. Pertinent negatives include no fever, joint swelling, numbness or tingling. The symptoms are aggravated by activity. She has tried oral narcotics (' I ll take a Vicodin here and there', used SalonPas) for the symptoms. The treatment provided moderate relief.     Pt. Is here to establish care. Previous PCP Dr. Hosie Spangle in [place]. Moved in November 2016 to [facility]   Past Medical History  Diagnosis Date  . Hypertension   . Scoliosis   . Bulging lumbar disc   . Neuropathy (HCC)   . Arthritis     in Lower back  . Schizoaffective disorder (HCC)   . Depression   . Anxiety   . Insomnia     Past Surgical History  Procedure Laterality Date  . Ectopic pregnancy surgery  1992  . Knee arthroscopy Right 1998  . Eye surgery    . Hernia repair  2009  . Ovarian cyst removal Right 1994    Family History  Problem Relation Age of Onset  . Hypertension Mother   . Diabetes Mother   . Heart disease Father   . Hypertension Father   . Depression Sister   . Schizophrenia Brother     Social History   Social History  . Marital Status: Single    Spouse Name: N/A  . Number of Children: N/A  . Years of Education: N/A   Occupational History  . Not on file.   Social History Main Topics  . Smoking status: Current Every Day Smoker -- 0.50 packs/day    Types: Cigarettes  . Smokeless tobacco: Not on file  .  Alcohol Use: Yes     Comment: rarely  . Drug Use: No  . Sexual Activity: No   Other Topics Concern  . Not on file   Social History Narrative     Current outpatient prescriptions:  .  alprazolam (XANAX) 2 MG tablet, Take 2 mg by mouth 2 (two) times daily. Reported on 09/13/2015, Disp: , Rfl:  .  amLODipine (NORVASC) 10 MG tablet, Take 10 mg by mouth daily., Disp: , Rfl:  .  pregabalin (LYRICA) 100 MG capsule, Take 100 mg by mouth 2 (two) times daily. Reported on 09/13/2015, Disp: , Rfl:  .  Venlafaxine HCl 225 MG TB24, Take 225 mg by mouth at bedtime. Reported on 09/13/2015, Disp: , Rfl:  .  zolpidem (AMBIEN) 10 MG tablet, Take 10 mg by mouth at bedtime as needed for sleep. Reported on 09/13/2015, Disp: , Rfl:  .  risperiDONE (RISPERDAL) 2 MG tablet, Take 2 mg by mouth 2 (two) times daily. Reported on 09/13/2015, Disp: , Rfl:   Allergies  Allergen Reactions  . Trazodone And Nefazodone Swelling     Review of Systems  Constitutional: Negative for fever and chills.  Musculoskeletal: Positive for joint pain and stiffness.  Neurological: Negative  for tingling and numbness.    Objective  Filed Vitals:   09/13/15 1522  BP: 108/77  Pulse: 100  Temp: 99.8 F (37.7 C)  TempSrc: Oral  Resp: 20  Height:  (1.702 m)  Weight: 253 lb 3.2 oz (114.851 kg)  SpO2: 98%    Physical Exam  Constitutional: She is well-developed, well-nourished, and in no distress.  Musculoskeletal:       Right shoulder: She exhibits decreased range of motion, tenderness, bony tenderness and pain.  Tenderness to palpation over the anterior shoulder, posterior shoulder along the lateral aspect of scapula, limited ROM 2/2 pain, raising shoulder past midline elicits sharp severe pain.  Nursing note and vitals reviewed.     Assessment & Plan  1. Chronic pain in right shoulder Will start on Oxycodone  to be taken every 8 hours as needed for severe pain, urgent referral to orthopedics.  - Ambulatory  referral to Orthopedic Surgery - DG Shoulder Right; Future - oxyCODONE (OXY IR/ROXICODONE) 5 MG immediate release tablet; Take 1 tablet (5 mg total) by mouth every 8 (eight) hours as needed for severe pain.  Dispense: 30 tablet; Refill: 0   Diamond Ballard [facility]ad [facility] 09/13/2015 4:16 PM

## 2015-09-14 ENCOUNTER — Telehealth: Payer: Self-pay | Admitting: Family Medicine

## 2015-09-14 NOTE — Telephone Encounter (Signed)
Called [facility] and gave the the referral ok for diabetic eye exam. Notified pt to call them to make herself an appt.

## 2015-09-14 NOTE — Telephone Encounter (Signed)
Pt is needing ref to [facility]. SHE IS HAVING A DIAB EYE EXAM W/ CONTACT LENS. SHE DOES THIS EVERY YEAR.  PT WAS JUST SEEN YESTERDAY BUT FORGOT TO MENTION THIS TO HIM

## 2015-09-22 ENCOUNTER — Other Ambulatory Visit: Payer: Self-pay | Admitting: Family Medicine

## 2015-09-22 DIAGNOSIS — M25511 Pain in right shoulder: Principal | ICD-10-CM

## 2015-09-22 DIAGNOSIS — G8929 Other chronic pain: Secondary | ICD-10-CM

## 2015-09-28 ENCOUNTER — Ambulatory Visit (INDEPENDENT_AMBULATORY_CARE_PROVIDER_SITE_OTHER): Payer: Medicaid Other | Admitting: Family Medicine

## 2015-09-28 ENCOUNTER — Telehealth: Payer: Self-pay | Admitting: Family Medicine

## 2015-09-28 ENCOUNTER — Encounter: Payer: Self-pay | Admitting: Family Medicine

## 2015-09-28 VITALS — BP 108/80 | HR 105 | Temp 99.4°F | Resp 19 | Ht 67.0 in | Wt 252.2 lb

## 2015-09-28 DIAGNOSIS — G8929 Other chronic pain: Secondary | ICD-10-CM

## 2015-09-28 DIAGNOSIS — M25511 Pain in right shoulder: Secondary | ICD-10-CM

## 2015-09-28 MED ORDER — OXYCODONE HCL 5 MG PO TABS: 5.0000 mg | ORAL_TABLET | Freq: Three times a day (TID) | ORAL | Status: DC | PRN

## 2015-09-28 NOTE — Progress Notes (Signed)
Name: Diamond Ballard   MRN: [id number]    DOB: [birth year]   Date:09/28/2015       Progress Note  Subjective  Chief Complaint  Chief Complaint  Patient presents with  . Referral    Pain Management    Shoulder Pain  The pain is present in the right shoulder. The pain is at a severity of 10/10 (10/10 today). The pain is severe. Pertinent negatives include no fever. She has tried oral narcotics (Also tried Cortisone shot by Orhtopedics, did not help.) for the symptoms.  X-ray of right shoulder reviewed, which is negative.  Past Medical History  Diagnosis Date  . Hypertension   . Scoliosis   . Bulging lumbar disc   . Neuropathy (HCC)   . Arthritis     in Lower back  . Schizoaffective disorder (HCC)   . Depression   . Anxiety   . Insomnia     Past Surgical History  Procedure Laterality Date  . Ectopic pregnancy surgery  1992  . Knee arthroscopy Right 1998  . Eye surgery    . Hernia repair  2009  . Ovarian cyst removal Right 1994    Family History  Problem Relation Age of Onset  . Hypertension Mother   . Diabetes Mother   . Heart disease Father   . Hypertension Father   . Depression Sister   . Schizophrenia Brother     Social History   Social History  . Marital Status: Single    Spouse Name: N/A  . Number of Children: N/A  . Years of Education: N/A   Occupational History  . Not on file.   Social History Main Topics  . Smoking status: Current Every Day Smoker -- 0.50 packs/day    Types: Cigarettes  . Smokeless tobacco: Not on file  . Alcohol Use: Yes     Comment: rarely  . Drug Use: No  . Sexual Activity: No   Other Topics Concern  . Not on file   Social History Narrative     Current outpatient prescriptions:  .  alprazolam (XANAX) 2 MG tablet, Take 2 mg by mouth 2 (two) times daily. Reported on 09/13/2015, Disp: , Rfl:  .  amLODipine (NORVASC) 10 MG tablet, Take 10 mg by mouth daily., Disp: , Rfl:  .  oxyCODONE (OXY IR/ROXICODONE) 5 MG immediate  release tablet, Take 1 tablet (5 mg total) by mouth every 8 (eight) hours as needed for severe pain., Disp: 30 tablet, Rfl: 0 .  pregabalin (LYRICA) 100 MG capsule, Take 100 mg by mouth 2 (two) times daily. Reported on 09/13/2015, Disp: , Rfl:  .  risperiDONE (RISPERDAL) 2 MG tablet, Take 2 mg by mouth 2 (two) times daily. Reported on 09/13/2015, Disp: , Rfl:  .  Venlafaxine HCl 225 MG TB24, Take 225 mg by mouth at bedtime. Reported on 09/13/2015, Disp: , Rfl:  .  zolpidem (AMBIEN) 10 MG tablet, Take 10 mg by mouth at bedtime as needed for sleep. Reported on 09/13/2015, Disp: , Rfl:   Allergies  Allergen Reactions  . Trazodone And Nefazodone Swelling     Review of Systems  Constitutional: Negative for fever.  Musculoskeletal: Positive for joint pain.     Objective  Filed Vitals:   09/28/15 1356  BP: 108/80  Pulse: 105  Temp: 99.4 F (37.4 C)  TempSrc: Oral  Resp: 19  Height:  (1.702 m)  Weight: 252 lb 3.2 oz (114.397 kg)  SpO2: 97%    Physical Exam  Constitutional: She is well-developed, well-nourished, and in no distress.  Musculoskeletal:       Right shoulder: She exhibits decreased range of motion, tenderness and pain. She exhibits no spasm.       Arms: Pin worse with raising arm, especially past midline  Nursing note and vitals reviewed.      Assessment & Plan  1. Chronic pain in right shoulder  Will refer to pain clinic, obtain MRI of shoulder to rule out rotator cuff pathology. Provided 10- day supply of oxycodone until she gets seen and established with pain clinic. - Ambulatory referral to Pain Clinic - MR Shoulder Right Wo Contrast; Future - oxyCODONE (OXY IR/ROXICODONE) 5 MG immediate release tablet; Take 1 tablet (5 mg total) by mouth every 8 (eight) hours as needed for severe pain.  Dispense: 30 tablet; Refill: 0   Colena Ketterman [facility] 09/28/2015 2:05 PM

## 2015-09-28 NOTE — Telephone Encounter (Signed)
Patient scheduled an appointment for eczema on 10/07/2015

## 2015-09-28 NOTE — Telephone Encounter (Signed)
Pt states she was in today to see the doctor and forgot to mention she needs something for excema to be called into [facility] [address]

## 2015-09-30 ENCOUNTER — Ambulatory Visit: Payer: Medicaid Other | Admitting: Family Medicine

## 2015-10-07 ENCOUNTER — Ambulatory Visit: Payer: Medicaid Other | Admitting: Family Medicine

## 2015-10-19 ENCOUNTER — Ambulatory Visit (INDEPENDENT_AMBULATORY_CARE_PROVIDER_SITE_OTHER): Payer: Medicaid Other | Admitting: Family Medicine

## 2015-10-19 ENCOUNTER — Encounter: Payer: Self-pay | Admitting: Family Medicine

## 2015-10-19 VITALS — BP 107/78 | HR 103 | Temp 99.3°F | Resp 18 | Ht 67.0 in | Wt 254.1 lb

## 2015-10-19 DIAGNOSIS — M25511 Pain in right shoulder: Secondary | ICD-10-CM | POA: Diagnosis not present

## 2015-10-19 DIAGNOSIS — G8929 Other chronic pain: Secondary | ICD-10-CM | POA: Diagnosis not present

## 2015-10-19 MED ORDER — OXYCODONE HCL 5 MG PO TABS: 5.0000 mg | ORAL_TABLET | Freq: Three times a day (TID) | ORAL | Status: DC | PRN

## 2015-10-19 MED ORDER — TIZANIDINE HCL 2 MG PO TABS: 2.0000 mg | ORAL_TABLET | Freq: Three times a day (TID) | ORAL | Status: AC | PRN

## 2015-10-19 MED ORDER — TIZANIDINE HCL 2 MG PO TABS: 2.0000 mg | ORAL_TABLET | Freq: Two times a day (BID) | ORAL | Status: DC | PRN

## 2015-10-19 NOTE — Progress Notes (Signed)
Name: Diamond Ballard   MRN: [id number]    DOB: Apr 12, 1971   Date:10/19/2015       Progress Note  Subjective  Chief Complaint  Chief Complaint  Patient presents with  . Follow-up    Blood Work     HPI  Shoulder Pain: Pain is rated at 10/10, gets worse after any activity such as shower. X ray of right shoulder was unremarkable and an MRI is scheduled for tomorrow. She is now beginning to have a burning sensation on her right upper back. She is scheduled to see Pain Management on April 4th, Pain Management policy is to not prescribe any medications or administer injections on the first visit. She will need refills until her appointment with pain management.   Past Medical History  Diagnosis Date  . Hypertension   . Scoliosis   . Bulging lumbar disc   . Neuropathy (HCC)   . Arthritis     in Lower back  . Schizoaffective disorder (HCC)   . Depression   . Anxiety   . Insomnia     Past Surgical History  Procedure Laterality Date  . Ectopic pregnancy surgery  1992  . Knee arthroscopy Right 1998  . Eye surgery    . Hernia repair  2009  . Ovarian cyst removal Right 1994    Family History  Problem Relation Age of Onset  . Hypertension Mother   . Diabetes Mother   . Heart disease Father   . Hypertension Father   . Depression Sister   . Schizophrenia Brother     Social History   Social History  . Marital Status: Single    Spouse Name: N/A  . Number of Children: N/A  . Years of Education: N/A   Occupational History  . Not on file.   Social History Main Topics  . Smoking status: Current Every Day Smoker -- 0.50 packs/day    Types: Cigarettes  . Smokeless tobacco: Not on file  . Alcohol Use: Yes     Comment: rarely  . Drug Use: No  . Sexual Activity: No   Other Topics Concern  . Not on file   Social History Narrative     Current outpatient prescriptions:  .  alprazolam (XANAX) 2 MG tablet, Take 2 mg by mouth 2 (two) times daily. Reported on 09/13/2015, Disp:  , Rfl:  .  amLODipine (NORVASC) 10 MG tablet, Take 10 mg by mouth daily., Disp: , Rfl:  .  oxyCODONE (OXY IR/ROXICODONE) 5 MG immediate release tablet, Take 1 tablet (5 mg total) by mouth every 8 (eight) hours as needed for severe pain., Disp: 30 tablet, Rfl: 0 .  pregabalin (LYRICA) 100 MG capsule, Take 100 mg by mouth 2 (two) times daily. Reported on 09/13/2015, Disp: , Rfl:  .  risperiDONE (RISPERDAL) 2 MG tablet, Take 2 mg by mouth 2 (two) times daily. Reported on 09/13/2015, Disp: , Rfl:  .  venlafaxine XR (EFFEXOR-XR) 150 MG 24 hr capsule, Take 150 mg by mouth daily., Disp: , Rfl: 0 .  zolpidem (AMBIEN) 10 MG tablet, Take 10 mg by mouth at bedtime as needed for sleep. Reported on 09/13/2015, Disp: , Rfl:   Allergies  Allergen Reactions  . Trazodone And Nefazodone Swelling     Review of Systems  Musculoskeletal: Positive for back pain, joint pain and neck pain.     Objective  Filed Vitals:   10/19/15 1127  BP: 107/78  Pulse: 103  Temp: 99.3 F (37.4 C)  TempSrc:  Oral  Resp: 18  Height: 5\' 7"  (1.702 m)  Weight: 254 lb 1.6 oz (115.259 kg)  SpO2: 96%    Physical Exam  Constitutional: She is well-developed, well-nourished, and in no distress.  Musculoskeletal:       Right shoulder: She exhibits tenderness, pain and spasm. She exhibits no swelling.       Arms: Intense pain on palpation over the right anterioir and posterior shoulder, worse with raising the arm and internal rotation. Posterior shoulder and upper back stiffness  Nursing note and vitals reviewed.     Assessment & Plan  1. Chronic pain in right shoulder Refills for oxycodone provided until patient's appointment with pain management. For stiffness in the upper back and shoulder, we'll start on tizanidine. Advised to use tizanidine at night as it can make her drowsy. Follow-up MRI tomorrow. - oxyCODONE (OXY IR/ROXICODONE) 5 MG immediate release tablet; Take 1 tablet (5 mg total) by mouth every 8 (eight) hours  as needed for severe pain.  Dispense: 45 tablet; Refill: 0 - tiZANidine (ZANAFLEX) 2 MG tablet; Take 1 tablet (2 mg total) by mouth every 8 (eight) hours as needed for muscle spasms.  Dispense: 45 tablet; Refill: 0   Avraj Lindroth [facility]ad [facility] 10/19/2015 11:41 AM

## 2015-10-20 ENCOUNTER — Ambulatory Visit
Admission: RE | Admit: 2015-10-20 | Discharge: 2015-10-20 | Disposition: A | Discharge status: Home / Self Care | Payer: Medicaid Other | Source: Ambulatory Visit | Attending: Family Medicine | Admitting: Family Medicine

## 2015-10-20 DIAGNOSIS — M25511 Pain in right shoulder: Secondary | ICD-10-CM | POA: Insufficient documentation

## 2015-10-20 DIAGNOSIS — G8929 Other chronic pain: Secondary | ICD-10-CM | POA: Diagnosis present

## 2015-10-20 DIAGNOSIS — M67813 Other specified disorders of tendon, right shoulder: Secondary | ICD-10-CM | POA: Diagnosis not present

## 2015-10-21 ENCOUNTER — Telehealth: Payer: Self-pay

## 2015-10-21 ENCOUNTER — Other Ambulatory Visit: Payer: Self-pay | Admitting: Family Medicine

## 2015-10-21 DIAGNOSIS — G8929 Other chronic pain: Secondary | ICD-10-CM

## 2015-10-21 DIAGNOSIS — M25511 Pain in right shoulder: Principal | ICD-10-CM

## 2015-10-21 NOTE — Telephone Encounter (Signed)
We will evaluate patient's symptoms and consider MRI if appropriate during her appointment.

## 2015-10-21 NOTE — Telephone Encounter (Signed)
Pt stated that she would like a MRI for her back and hip. She said that she had spoke to you about it during her last appointment. Pt stated that she has appointment coming up on 11/03/15 and would like it ordered by then if possible.

## 2015-11-02 ENCOUNTER — Ambulatory Visit: Payer: Medicaid Other | Attending: Anesthesiology | Admitting: Anesthesiology

## 2015-11-02 ENCOUNTER — Encounter: Payer: Self-pay | Admitting: Anesthesiology

## 2015-11-02 VITALS — BP 118/75 | HR 94 | Temp 98.4°F | Resp 18 | Ht 68.0 in | Wt 254.0 lb

## 2015-11-02 DIAGNOSIS — E119 Type 2 diabetes mellitus without complications: Secondary | ICD-10-CM | POA: Diagnosis not present

## 2015-11-02 DIAGNOSIS — I10 Essential (primary) hypertension: Secondary | ICD-10-CM | POA: Insufficient documentation

## 2015-11-02 DIAGNOSIS — G8929 Other chronic pain: Secondary | ICD-10-CM | POA: Insufficient documentation

## 2015-11-02 DIAGNOSIS — M7501 Adhesive capsulitis of right shoulder: Secondary | ICD-10-CM | POA: Insufficient documentation

## 2015-11-02 DIAGNOSIS — M545 Low back pain, unspecified: Secondary | ICD-10-CM

## 2015-11-02 DIAGNOSIS — M5116 Intervertebral disc disorders with radiculopathy, lumbar region: Secondary | ICD-10-CM | POA: Insufficient documentation

## 2015-11-02 DIAGNOSIS — F329 Major depressive disorder, single episode, unspecified: Secondary | ICD-10-CM | POA: Insufficient documentation

## 2015-11-02 DIAGNOSIS — B1920 Unspecified viral hepatitis C without hepatic coma: Secondary | ICD-10-CM | POA: Insufficient documentation

## 2015-11-02 DIAGNOSIS — F17200 Nicotine dependence, unspecified, uncomplicated: Secondary | ICD-10-CM | POA: Insufficient documentation

## 2015-11-02 DIAGNOSIS — M5416 Radiculopathy, lumbar region: Secondary | ICD-10-CM

## 2015-11-02 DIAGNOSIS — M25511 Pain in right shoulder: Secondary | ICD-10-CM

## 2015-11-02 DIAGNOSIS — M25562 Pain in left knee: Secondary | ICD-10-CM | POA: Diagnosis not present

## 2015-11-02 DIAGNOSIS — M5137 Other intervertebral disc degeneration, lumbosacral region: Secondary | ICD-10-CM

## 2015-11-02 MED ORDER — OXYCODONE HCL 5 MG PO TABS: 5.0000 mg | ORAL_TABLET | Freq: Two times a day (BID) | ORAL | Status: DC

## 2015-11-02 MED ORDER — MELOXICAM 7.5 MG PO TABS: 7.5000 mg | ORAL_TABLET | Freq: Two times a day (BID) | ORAL | Status: DC

## 2015-11-02 NOTE — Patient Instructions (Addendum)
You were given a prescription for Meloxicam and a prescription for Oxycodone today. Epidural Steroid Injection Patient Information  Description: The epidural space surrounds the nerves as they exit the spinal cord.  In some patients, the nerves can be compressed and inflamed by a bulging disc or a tight spinal canal (spinal stenosis).  By injecting steroids into the epidural space, we can bring irritated nerves into direct contact with a potentially helpful medication.  These steroids act directly on the irritated nerves and can reduce swelling and inflammation which often leads to decreased pain.  Epidural steroids may be injected anywhere along the spine and from the neck to the low back depending upon the location of your pain.   After numbing the skin with local anesthetic (like Novocaine), a small needle is passed into the epidural space slowly.  You may experience a sensation of pressure while this is being done.  The entire block usually last less than 10 minutes.  Conditions which may be treated by epidural steroids:   Low back and leg pain  Neck and arm pain  Spinal stenosis  Post-laminectomy syndrome  Herpes zoster (shingles) pain  Pain from compression fractures  Preparation for the injection:  1. Do not eat any solid food or dairy products within 8 hours of your appointment.  2. You may drink clear liquids up to 3 hours before appointment.  Clear liquids include water, black coffee, juice or soda.  No milk or cream please. 3. You may take your regular medication, including pain medications, with a sip of water before your appointment  Diabetics should hold regular insulin (if taken separately) and take 1/2 normal NPH dos the morning of the procedure.  Carry some sugar containing items with you to your appointment. 4. A driver must accompany you and be prepared to drive you home after your procedure.  5. Bring all your current medications with your. 6. An IV may be inserted  and sedation may be given at the discretion of the physician.   7. A blood pressure cuff, EKG and other monitors will often be applied during the procedure.  Some patients may need to have extra oxygen administered for a short period. 8. You will be asked to provide medical information, including your allergies, prior to the procedure.  We must know immediately if you are taking blood thinners (like Coumadin/Warfarin)  Or if you are allergic to IV iodine contrast (dye). We must know if you could possible be pregnant.  Possible side-effects:  Bleeding from needle site  Infection (rare, may require surgery)  Nerve injury (rare)  Numbness & tingling (temporary)  Difficulty urinating (rare, temporary)  Spinal headache ( a headache worse with upright posture)  Light -headedness (temporary)  Pain at injection site (several days)  Decreased blood pressure (temporary)  Weakness in arm/leg (temporary)  Pressure sensation in back/neck (temporary)  Call if you experience:  Fever/chills associated with headache or increased back/neck pain.  Headache worsened by an upright position.  New onset weakness or numbness of an extremity below the injection site  Hives or difficulty breathing (go to the emergency room)  Inflammation or drainage at the infection site  Severe back/neck pain  Any new symptoms which are concerning to you  Please note:  Although the local anesthetic injected can often make your back or neck feel good for several hours after the injection, the pain will likely return.  It takes 3-7 days for steroids to work in the epidural space.  You  may not notice any pain relief for at least that one week.  If effective, we will often do a series of three injections spaced 3-6 weeks apart to maximally decrease your pain.  After the initial series, we generally will wait several months before considering a repeat injection of the same type.  If you have any questions, please  call [phone] [facility]
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
## 2015-11-02 NOTE — Progress Notes (Signed)
Safety precautions to be maintained throughout the outpatient stay will include: orient to surroundings, keep bed in low position, maintain call bell within reach at all times, provide assistance with transfer out of bed and ambulation.  

## 2015-11-03 ENCOUNTER — Ambulatory Visit (INDEPENDENT_AMBULATORY_CARE_PROVIDER_SITE_OTHER): Payer: Medicaid Other | Admitting: Family Medicine

## 2015-11-03 ENCOUNTER — Encounter: Payer: Self-pay | Admitting: Family Medicine

## 2015-11-03 VITALS — BP 109/80 | HR 104 | Temp 98.8°F | Resp 19 | Ht 67.0 in | Wt 255.3 lb

## 2015-11-03 DIAGNOSIS — IMO0001 Reserved for inherently not codable concepts without codable children: Secondary | ICD-10-CM

## 2015-11-03 LAB — POCT GLYCOSYLATED HEMOGLOBIN (HGB A1C): Hemoglobin A1C: 5.8

## 2015-11-03 NOTE — Progress Notes (Signed)
Name: Diamond Ballard   MRN: [id number]    DOB: Mar 22, 1971   Date:11/03/2015       Progress Note  Subjective  Chief Complaint  Chief Complaint  Patient presents with  . Follow-up    Labs    HPI  Obesity: Pt. Is here for weight management. Current weight is 255lb, BMI 39.99kg/m2. She tries to eat healthy (breakfast includes oatmeal, slice of wheat toast, and boiled. Lunch includes salad, rice, and salmon and onions. Dinner includes baked chicken, baked [place] wings, and chicken stew with noodles). She also eats fast foods and has snacks sometimes in between the meals. Generally does not eat a lot of sweets but lately had cake from her cousin's birthday.  As far as activity is concerned, she is mostly sedentary (may take the dog out, take the trash out, or walk up and down stairs). No regular exercise regimen.   Past Medical History  Diagnosis Date  . Hypertension   . Scoliosis   . Bulging lumbar disc   . Neuropathy (HCC)   . Arthritis     in Lower back  . Schizoaffective disorder (HCC)   . Depression   . Anxiety   . Insomnia     Past Surgical History  Procedure Laterality Date  . Ectopic pregnancy surgery  1992  . Knee arthroscopy Right 1998  . Eye surgery    . Hernia repair  2009  . Ovarian cyst removal Right 1994    Family History  Problem Relation Age of Onset  . Hypertension Mother   . Diabetes Mother   . Heart disease Father   . Hypertension Father   . Depression Sister   . Schizophrenia Brother     Social History   Social History  . Marital Status: Single    Spouse Name: N/A  . Number of Children: N/A  . Years of Education: N/A   Occupational History  . Not on file.   Social History Main Topics  . Smoking status: Current Every Day Smoker -- 0.50 packs/day    Types: Cigarettes  . Smokeless tobacco: Not on file  . Alcohol Use: Yes     Comment: rarely  . Drug Use: No  . Sexual Activity: No   Other Topics Concern  . Not on file   Social History  Narrative     Current outpatient prescriptions:  .  alprazolam (XANAX) 2 MG tablet, Take 2 mg by mouth 2 (two) times daily. Reported on 11/02/2015, Disp: , Rfl:  .  amLODipine (NORVASC) 10 MG tablet, Take 10 mg by mouth daily., Disp: , Rfl:  .  meloxicam (MOBIC) 7.5 MG tablet, Take 1 tablet (7.5 mg total) by mouth 2 (two) times daily after a meal., Disp: 42 tablet, Rfl: 1 .  oxyCODONE (OXY IR/ROXICODONE) 5 MG immediate release tablet, Take 1 tablet (5 mg total) by mouth 2 (two) times daily after a meal., Disp: 45 tablet, Rfl: 0 .  pregabalin (LYRICA) 100 MG capsule, Take 100 mg by mouth 2 (two) times daily. Reported on 11/02/2015, Disp: , Rfl:  .  risperiDONE (RISPERDAL) 2 MG tablet, Take 2 mg by mouth 2 (two) times daily. Reported on 09/13/2015, Disp: , Rfl:  .  tiZANidine (ZANAFLEX) 2 MG tablet, Take 1 tablet (2 mg total) by mouth every 8 (eight) hours as needed for muscle spasms., Disp: 45 tablet, Rfl: 0 .  venlafaxine XR (EFFEXOR-XR) 150 MG 24 hr capsule, Take 150 mg by mouth daily., Disp: , Rfl: 0 .  zolpidem (AMBIEN) 10 MG tablet, Take 10 mg by mouth at bedtime as needed for sleep. Reported on 09/13/2015, Disp: , Rfl:   Allergies  Allergen Reactions  . Aspirin Nausea And Vomiting  . Trazodone And Nefazodone Swelling     Review of Systems  Constitutional: Negative for fever and chills.  Respiratory: Positive for shortness of breath.   Cardiovascular: Negative for chest pain.  Gastrointestinal: Positive for heartburn.  Musculoskeletal: Positive for joint pain.      Objective  Filed Vitals:   11/03/15 0841  BP: 109/80  Pulse: 104  Temp: 98.8 F (37.1 C)  TempSrc: Oral  Resp: 19  Height: 5\' 7"  (1.702 m)  Weight: 255 lb 4.8 oz (115.803 kg)  SpO2: 95%    Physical Exam  Constitutional: She is oriented to person, place, and time and well-developed, well-nourished, and in no distress.  HENT:  Head: Normocephalic and atraumatic.  Neck: No thyroid mass present.   Cardiovascular: Normal rate and regular rhythm.   Pulmonary/Chest: Effort normal and breath sounds normal.  Abdominal: Soft. Bowel sounds are normal.  Neurological: She is alert and oriented to person, place, and time.  Nursing note and vitals reviewed.     Assessment & Plan  1. Obesity, Class II, BMI 35-39.9, with comorbidity (HCC) Likely from excess calories, [facility]. Obtain laboratory evaluation to rule out hypothyroidism, hyperlipidemia, and diabetes. - POCT HgB A1C - Lipid Profile - TSH - Amb ref to Medical Nutrition Therapy-MNT   Maryfer Tauzin Asad A. Faylene KurtzShah [facility] 11/03/2015 8:46 AM

## 2015-11-04 LAB — LIPID PANEL
Chol/HDL Ratio: 2.5 ratio units (ref 0.0–4.4)
Cholesterol, Total: 182 mg/dL (ref 100–199)
HDL: 72 mg/dL (ref >39)
LDL CALC: 92 mg/dL (ref 0–99)
Triglycerides: 90 mg/dL (ref 0–149)
VLDL CHOLESTEROL CAL: 18 mg/dL (ref 5–40)

## 2015-11-04 LAB — TSH: TSH: 0.603 u[IU]/mL (ref 0.450–4.500)

## 2015-11-05 ENCOUNTER — Ambulatory Visit: Payer: Medicaid Other | Attending: Anesthesiology | Admitting: Anesthesiology

## 2015-11-05 ENCOUNTER — Encounter: Payer: Self-pay | Admitting: Anesthesiology

## 2015-11-05 VITALS — BP 153/93 | HR 82 | Temp 98.0°F | Resp 16 | Ht 67.0 in | Wt 255.0 lb

## 2015-11-05 DIAGNOSIS — M51379 Other intervertebral disc degeneration, lumbosacral region without mention of lumbar back pain or lower extremity pain: Secondary | ICD-10-CM | POA: Insufficient documentation

## 2015-11-05 DIAGNOSIS — G8929 Other chronic pain: Secondary | ICD-10-CM | POA: Diagnosis not present

## 2015-11-05 DIAGNOSIS — M545 Low back pain, unspecified: Secondary | ICD-10-CM

## 2015-11-05 DIAGNOSIS — M5416 Radiculopathy, lumbar region: Secondary | ICD-10-CM | POA: Insufficient documentation

## 2015-11-05 DIAGNOSIS — M5137 Other intervertebral disc degeneration, lumbosacral region: Secondary | ICD-10-CM | POA: Insufficient documentation

## 2015-11-05 DIAGNOSIS — M5116 Intervertebral disc disorders with radiculopathy, lumbar region: Secondary | ICD-10-CM | POA: Diagnosis not present

## 2015-11-05 MED ORDER — TRIAMCINOLONE ACETONIDE 40 MG/ML IJ SUSP
INTRAMUSCULAR | Status: AC
  Administered 2015-11-05: 10:00:00
  Filled 2015-11-05: qty 2

## 2015-11-05 MED ORDER — BUPIVACAINE HCL (PF) 0.25 % IJ SOLN: 20.0000 mL | Freq: Once | INTRAMUSCULAR | Status: AC

## 2015-11-05 MED ORDER — MIDAZOLAM HCL 5 MG/5ML IJ SOLN: 1.0000 mg | INTRAMUSCULAR | Status: AC

## 2015-11-05 MED ORDER — MIDAZOLAM HCL 5 MG/5ML IJ SOLN
INTRAMUSCULAR | Status: AC
  Administered 2015-11-05: 2 mg via INTRAVENOUS
  Filled 2015-11-05: qty 5

## 2015-11-05 MED ORDER — FENTANYL CITRATE (PF) 100 MCG/2ML IJ SOLN: 50.0000 ug | INTRAMUSCULAR | Status: AC

## 2015-11-05 MED ORDER — TRIAMCINOLONE ACETONIDE 40 MG/ML IJ SUSP: 80.0000 mg | Freq: Once | INTRAMUSCULAR | Status: AC

## 2015-11-05 MED ORDER — BUPIVACAINE HCL (PF) 0.25 % IJ SOLN
INTRAMUSCULAR | Status: AC
  Administered 2015-11-05: 10:00:00
  Filled 2015-11-05: qty 30

## 2015-11-05 MED ORDER — IOPAMIDOL (ISOVUE-M 200) INJECTION 41%: 20.0000 mL | INTRAMUSCULAR | Status: AC | PRN

## 2015-11-05 MED ORDER — IOPAMIDOL (ISOVUE-M 200) INJECTION 41%
INTRAMUSCULAR | Status: AC
  Administered 2015-11-05: 10:00:00
  Filled 2015-11-05: qty 10

## 2015-11-05 NOTE — Patient Instructions (Signed)
GENERAL RISKS AND COMPLICATIONS  What are the risk, side effects and possible complications? Generally speaking, most procedures are safe.  However, with any procedure there are risks, side effects, and the possibility of complications.  The risks and complications are dependent upon the sites that are lesioned, or the type of nerve block to be performed.  The closer the procedure is to the spine, the more serious the risks are.  Great care is taken when placing the radio frequency needles, block needles or lesioning probes, but sometimes complications can occur. 1. Infection: Any time there is an injection through the skin, there is a risk of infection.  This is why sterile conditions are used for these blocks.  There are four possible types of infection. 1. Localized skin infection. 2. Central Nervous System Infection-This can be in the form of Meningitis, which can be deadly. 3. Epidural Infections-This can be in the form of an epidural abscess, which can cause pressure inside of the spine, causing compression of the spinal cord with subsequent paralysis. This would require an emergency surgery to decompress, and there are no guarantees that the patient would recover from the paralysis. 4. Discitis-This is an infection of the intervertebral discs.  It occurs in about 1% of discography procedures.  It is difficult to treat and it may lead to surgery.        2. Pain: the needles have to go through skin and soft tissues, will cause soreness.       3. Damage to internal structures:  The nerves to be lesioned may be near blood vessels or    other nerves which can be potentially damaged.       4. Bleeding: Bleeding is more common if the patient is taking blood thinners such as  aspirin, Coumadin, Ticiid, Plavix, etc., or if he/she have some genetic predisposition  such as hemophilia. Bleeding into the spinal canal can cause compression of the spinal  cord with subsequent paralysis.  This would require an  emergency surgery to  decompress and there are no guarantees that the patient would recover from the  paralysis.       5. Pneumothorax:  Puncturing of a lung is a possibility, every time a needle is introduced in  the area of the chest or upper back.  Pneumothorax refers to free air around the  collapsed lung(s), inside of the thoracic cavity (chest cavity).  Another two possible  complications related to a similar event would include: Hemothorax and Chylothorax.   These are variations of the Pneumothorax, where instead of air around the collapsed  lung(s), you may have blood or chyle, respectively.       6. Spinal headaches: They may occur with any procedures in the area of the spine.       7. Persistent CSF (Cerebro-Spinal Fluid) leakage: This is a rare problem, but may occur  with prolonged intrathecal or epidural catheters either due to the formation of a fistulous  track or a dural tear.       8. Nerve damage: By working so close to the spinal cord, there is always a possibility of  nerve damage, which could be as serious as a permanent spinal cord injury with  paralysis.       9. Death:  Although rare, severe deadly allergic reactions known as "Anaphylactic  reaction" can occur to any of the medications used.      10. Worsening of the symptoms:  We can always make thing worse.    What are the chances of something like this happening? Chances of any of this occuring are extremely low.  By statistics, you have more of a chance of getting killed in a motor vehicle accident: while driving to the hospital than any of the above occurring .  Nevertheless, you should be aware that they are possibilities.  In general, it is similar to taking a shower.  Everybody knows that you can slip, hit your head and get killed.  Does that mean that you should not shower again?  Nevertheless always keep in mind that statistics do not mean anything if you happen to be on the wrong side of them.  Even if a procedure has a 1  (one) in a 1,000,000 (million) chance of going wrong, it you happen to be that one..Also, keep in mind that by statistics, you have more of a chance of having something go wrong when taking medications.  Who should not have this procedure? If you are on a blood thinning medication (e.g. Coumadin, Plavix, see list of "Blood Thinners"), or if you have an active infection going on, you should not have the procedure.  If you are taking any blood thinners, please inform your physician.  How should I prepare for this procedure?  Do not eat or drink anything at least six hours prior to the procedure.  Bring a driver with you .  It cannot be a taxi.  Come accompanied by an adult that can drive you back, and that is strong enough to help you if your legs get weak or numb from the local anesthetic.  Take all of your medicines the morning of the procedure with just enough water to swallow them.  If you have diabetes, make sure that you are scheduled to have your procedure done first thing in the morning, whenever possible.  If you have diabetes, take only half of your insulin dose and notify our nurse that you have done so as soon as you arrive at the clinic.  If you are diabetic, but only take blood sugar pills (oral hypoglycemic), then do not take them on the morning of your procedure.  You may take them after you have had the procedure.  Do not take aspirin or any aspirin-containing medications, at least eleven (11) days prior to the procedure.  They may prolong bleeding.  Wear loose fitting clothing that may be easy to take off and that you would not mind if it got stained with Betadine or blood.  Do not wear any jewelry or perfume  Remove any nail coloring.  It will interfere with some of our monitoring equipment.  NOTE: Remember that this is not meant to be interpreted as a complete list of all possible complications.  Unforeseen problems may occur.  BLOOD THINNERS The following drugs  contain aspirin or other products, which can cause increased bleeding during surgery and should not be taken for 2 weeks prior to and 1 week after surgery.  If you should need take something for relief of minor pain, you may take acetaminophen which is found in Tylenol,m Datril, Anacin-3 and Panadol. It is not blood thinner. The products listed below are.  Do not take any of the products listed below in addition to any listed on your instruction sheet.  A.P.C or A.P.C with Codeine Codeine Phosphate Capsules #3 Ibuprofen Ridaura  ABC compound Congesprin Imuran rimadil  Advil Cope Indocin Robaxisal  Alka-Seltzer Effervescent Pain Reliever and Antacid Coricidin or Coricidin-D  Indomethacin Rufen    Alka-Seltzer plus Cold Medicine Cosprin Ketoprofen S-A-C Tablets  Anacin Analgesic Tablets or Capsules Coumadin Korlgesic Salflex  Anacin Extra Strength Analgesic tablets or capsules CP-2 Tablets Lanoril Salicylate  Anaprox Cuprimine Capsules Levenox Salocol  Anexsia-D Dalteparin Magan Salsalate  Anodynos Darvon compound Magnesium Salicylate Sine-off  Ansaid Dasin Capsules Magsal Sodium Salicylate  Anturane Depen Capsules Marnal Soma  APF Arthritis pain formula Dewitt's Pills Measurin Stanback  Argesic Dia-Gesic Meclofenamic Sulfinpyrazone  Arthritis Bayer Timed Release Aspirin Diclofenac Meclomen Sulindac  Arthritis pain formula Anacin Dicumarol Medipren Supac  Analgesic (Safety coated) Arthralgen Diffunasal Mefanamic Suprofen  Arthritis Strength Bufferin Dihydrocodeine Mepro Compound Suprol  Arthropan liquid Dopirydamole Methcarbomol with Aspirin Synalgos  ASA tablets/Enseals Disalcid Micrainin Tagament  Ascriptin Doan's Midol Talwin  Ascriptin A/D Dolene Mobidin Tanderil  Ascriptin Extra Strength Dolobid Moblgesic Ticlid  Ascriptin with Codeine Doloprin or Doloprin with Codeine Momentum Tolectin  Asperbuf Duoprin Mono-gesic Trendar  Aspergum Duradyne Motrin or Motrin IB Triminicin  Aspirin  plain, buffered or enteric coated Durasal Myochrisine Trigesic  Aspirin Suppositories Easprin Nalfon Trillsate  Aspirin with Codeine Ecotrin Regular or Extra Strength Naprosyn Uracel  Atromid-S Efficin Naproxen Ursinus  Auranofin Capsules Elmiron Neocylate Vanquish  Axotal Emagrin Norgesic Verin  Azathioprine Empirin or Empirin with Codeine Normiflo Vitamin E  Azolid Emprazil Nuprin Voltaren  Bayer Aspirin plain, buffered or children's or timed BC Tablets or powders Encaprin Orgaran Warfarin Sodium  Buff-a-Comp Enoxaparin Orudis Zorpin  Buff-a-Comp with Codeine Equegesic Os-Cal-Gesic   Buffaprin Excedrin plain, buffered or Extra Strength Oxalid   Bufferin Arthritis Strength Feldene Oxphenbutazone   Bufferin plain or Extra Strength Feldene Capsules Oxycodone with Aspirin   Bufferin with Codeine Fenoprofen Fenoprofen Pabalate or Pabalate-SF   Buffets II Flogesic Panagesic   Buffinol plain or Extra Strength Florinal or Florinal with Codeine Panwarfarin   Buf-Tabs Flurbiprofen Penicillamine   Butalbital Compound Four-way cold tablets Penicillin   Butazolidin Fragmin Pepto-Bismol   Carbenicillin Geminisyn Percodan   Carna Arthritis Reliever Geopen Persantine   Carprofen Gold's salt Persistin   Chloramphenicol Goody's Phenylbutazone   Chloromycetin Haltrain Piroxlcam   Clmetidine heparin Plaquenil   Cllnoril Hyco-pap Ponstel   Clofibrate Hydroxy chloroquine Propoxyphen         Before stopping any of these medications, be sure to consult the physician who ordered them.  Some, such as Coumadin (Warfarin) are ordered to prevent or treat serious conditions such as "deep thrombosis", "pumonary embolisms", and other heart problems.  The amount of time that you may need off of the medication may also vary with the medication and the reason for which you were taking it.  If you are taking any of these medications, please make sure you notify your pain physician before you undergo any  procedures.         Epidural Steroid Injection An epidural steroid injection is given to relieve pain in your neck, back, or legs that is caused by the irritation or swelling of a nerve root. This procedure involves injecting a steroid and numbing medicine (anesthetic) into the epidural space. The epidural space is the space between the outer covering of your spinal cord and the bones that form your backbone (vertebra).  LET [facility] CARE PROVIDER KNOW ABOUT:  2. Any allergies you have. 3. All medicines you are taking, including vitamins, herbs, eye drops, creams, and over-the-counter medicines such as aspirin. 4. Previous problems you or members of your family have had with the use of anesthetics. 5. Any blood disorders or blood clotting disorders you have.  6. Previous surgeries you have had. 7. Medical conditions you have. RISKS AND COMPLICATIONS Generally, this is a safe procedure. However, as with any procedure, complications can occur. Possible complications of epidural steroid injection include:  Headache.  Bleeding.  Infection.  Allergic reaction to the medicines.  Damage to your nerves. The response to this procedure depends on the underlying cause of the pain and its duration. People who have long-term (chronic) pain are less likely to benefit from epidural steroids than are those people whose pain comes on strong and suddenly. BEFORE THE PROCEDURE   Ask your health care provider about changing or stopping your regular medicines. You may be advised to stop taking blood-thinning medicines a few days before the procedure.  You may be given medicines to reduce anxiety.  Arrange for someone to take you home after the procedure. PROCEDURE   You will remain awake during the procedure. You may receive medicine to make you relaxed.  You will be asked to lie on your stomach.  The injection site will be cleaned.  The injection site will be numbed with a medicine (local  anesthetic).  A needle will be injected through your skin into the epidural space.  Your health care provider will use an X-ray machine to ensure that the steroid is delivered closest to the affected nerve. You may have minimal discomfort at this time.  Once the needle is in the right position, the local anesthetic and the steroid will be injected into the epidural space.  The needle will then be removed and a bandage will be applied to the injection site. AFTER THE PROCEDURE  12. You may be monitored for a short time before you go home. 13. You may feel weakness or numbness in your arm or leg, which disappears within hours. 14. You may be allowed to eat, drink, and take your regular medicine. 15. You may have soreness at the site of the injection.   This information is not intended to replace advice given to you by your health care provider. Make sure you discuss any questions you have with your health care provider.   Document Released: 10/24/2007 Document Revised: 03/19/2013 Document Reviewed: 01/03/2013 Elsevier Interactive Patient Education 2016 [employer] Pain Management Discharge Instructions  General Discharge Instructions :  If you need to reach your doctor call: Monday-Friday 8:00 am - 4:00 pm at [phone] or toll free [phone].  After clinic hours [phone] to have operator reach doctor.  Bring all of your medication bottles to all your appointments in the pain clinic.  To cancel or reschedule your appointment with Pain Management please remember to call 24 hours in advance to avoid a fee.  Refer to the educational materials which you have been given on: General Risks, I had my Procedure. Discharge Instructions, Post Sedation.  Post Procedure Instructions:  The drugs you were given will stay in your system until tomorrow, so for the next 24 hours you should not drive, make any legal decisions or drink any alcoholic beverages.  You may eat anything you  prefer, but it is better to start with liquids then soups and crackers, and gradually work up to solid foods.  Please notify your doctor immediately if you have any unusual bleeding, trouble breathing or pain that is not related to your normal pain.  Depending on the type of procedure that was done, some parts of your body may feel week and/or numb.  This usually clears up by tonight or the next day.  Walk  with the use of an assistive device or accompanied by an adult for the 24 hours.  You may use ice on the affected area for the first 24 hours.  Put ice in a Ziploc bag and cover with a towel and place against area 15 minutes on 15 minutes off.  You may switch to heat after 24 hours.

## 2015-11-05 NOTE — Procedures (Signed)
Date of procedure:  11/05/2015  Preoperative Diagnosis:  1 chronic low back pain 2 lumbar degenerative disc disease 3 lumbar radiculopathy  Postoperative Diagnosis:  Same.  Procedure: 1. Caudal epidural steroid injection, 2. Epidural with interpretation. 3. Fluoroscopic guidance.  Surgeon: Tod PersiaWinston Xitlaly Ault, MD  Anesthesia: MAC anesthesia by the nurse and staff under my direction  Informed consent was obtained and the patient appeared to accept and understand the benefits and risks of this procedure.   Pre procedure comments:    Description of the Procedure:  The patient was taken to the operating room and placed in the prone position.  Intravenous sedation and MAC anesthesia was administered by the nurse and staff under my direction. After appropriate sedation, the sacrococcygeal area was prepped with Betadine.  After adequate draping, the area between the sacral cornu was palpated and infiltrated with 3 cc of 1% Lidocaine.   An AP fluoroscopic view of the sacrum was visualized and a 17 gauge Tuohy needle was inserted in the midline at the angle of 45 degrees through the sacrococcygeal membrane.  After making contact with the bone, the needle was withdrawn and readvanced in horizontal position, into the caudal epidural space.  Epidurogram Study: One cc of Isovue M 200 was injected through the needle and epidurogram was visualized in both the later and AP views. After injecting the contrast through the Tuohy needle the dye was observed to spread cephalad as high as L3 The spread was reasonably symmetrical except for the L5 level on the left where the spread was somewhat inhibited suggestive of neural foraminal stenosis  Comments:   This procedure was done using fluoroscopic guidance Number of fluoroscopic frames were to frames Fluoroscopic time was 0.3 minutes MG Y was 13.0 No catheter was used   Caudal Epidural Steroid Injection:  Then 10 cc of 0.25% Bupivacaine and 80 mg  of Kenalog were injected into the Caudal epidural space.  The needle was removed and adequate hemostasis was established.    The patient tolerated the procedure quite well and vital signs were stable.  There were no adverse effects.  Additional comments:    The patient was taken to the recovery room in satisfactory condition where the patient was observed and subsequently discharged home.   Will follow up in the clinic in 2 months    Tod PersiaWinston Larae Caison M.D.

## 2015-11-08 ENCOUNTER — Telehealth: Payer: Self-pay | Admitting: *Deleted

## 2015-11-08 NOTE — Telephone Encounter (Signed)
No problems post procedure. 

## 2015-11-09 LAB — TOXASSURE SELECT 13 (MW), URINE: PDF: 0

## 2015-11-10 ENCOUNTER — Telehealth: Payer: Self-pay | Admitting: Family Medicine

## 2015-11-10 NOTE — Telephone Encounter (Signed)
Pt would like to know the results to her lab work.

## 2015-11-10 NOTE — Telephone Encounter (Signed)
Lab results have been reported to patient  

## 2015-11-17 ENCOUNTER — Telehealth: Payer: Self-pay | Admitting: Anesthesiology

## 2015-11-17 NOTE — Telephone Encounter (Signed)
Patient called to confirm her med refill appointment for oxycodone and myloxicam. The next appointment scheduled for her is 12/31/2015 and she states she will need refills  Prior to that   please check on date for meds   Thank you

## 2015-11-17 NOTE — Telephone Encounter (Signed)
Patient advised to call back on May 5 when Dr. Starling MannsParris is here. Will fill scripts then.

## 2015-11-22 ENCOUNTER — Telehealth: Payer: Self-pay | Admitting: Anesthesiology

## 2015-11-22 NOTE — Telephone Encounter (Signed)
Out of meds and needs refill, last appt was 11-05-15, has appt sched for 12-31-15 for procedure, please call patient to discuss

## 2015-11-22 NOTE — Telephone Encounter (Signed)
Patient reminded of conversaion on 11-17-15. Instructed patient to call here on May 5 to ask for prescription.

## 2015-11-26 ENCOUNTER — Encounter: Payer: Self-pay | Admitting: Family Medicine

## 2015-11-26 ENCOUNTER — Ambulatory Visit (INDEPENDENT_AMBULATORY_CARE_PROVIDER_SITE_OTHER): Payer: Medicaid Other | Admitting: Family Medicine

## 2015-11-26 VITALS — BP 141/80 | HR 113 | Temp 99.2°F | Resp 19 | Ht 67.0 in | Wt 253.5 lb

## 2015-11-26 DIAGNOSIS — J0100 Acute maxillary sinusitis, unspecified: Secondary | ICD-10-CM

## 2015-11-26 DIAGNOSIS — R000 Tachycardia, unspecified: Secondary | ICD-10-CM | POA: Diagnosis not present

## 2015-11-26 DIAGNOSIS — R05 Cough: Secondary | ICD-10-CM | POA: Diagnosis not present

## 2015-11-26 DIAGNOSIS — R062 Wheezing: Secondary | ICD-10-CM | POA: Diagnosis not present

## 2015-11-26 DIAGNOSIS — R053 Chronic cough: Secondary | ICD-10-CM

## 2015-11-26 MED ORDER — AZITHROMYCIN 250 MG PO TABS
ORAL_TABLET | ORAL | Status: DC
Start: 2015-11-26 — End: 2015-12-30

## 2015-11-26 MED ORDER — BENZONATATE 200 MG PO CAPS: 200.0000 mg | ORAL_CAPSULE | Freq: Three times a day (TID) | ORAL | Status: DC | PRN

## 2015-11-26 NOTE — Progress Notes (Signed)
Name: Diamond Ballard   MRN: [id number]    DOB: [birth year]   Date:11/26/2015       Progress Note  Subjective  Chief Complaint  Chief Complaint  Patient presents with  . Follow-up    2 WK     HPI  Pt. Presents for dry cough, present for over a week, associated symptoms include runny nose sinus congestion, and chest pain from frequent coughing. She is having to clear her throat a lot.  She has taken Robitussin and Vicks Night time, both of which did not help her symptoms.   Past Medical History  Diagnosis Date  . Hypertension   . Scoliosis   . Bulging lumbar disc   . Neuropathy (HCC)   . Arthritis     in Lower back  . Schizoaffective disorder (HCC)   . Depression   . Anxiety   . Insomnia     Past Surgical History  Procedure Laterality Date  . Ectopic pregnancy surgery  1992  . Knee arthroscopy Right 1998  . Eye surgery    . Hernia repair  2009  . Ovarian cyst removal Right 1994    Family History  Problem Relation Age of Onset  . Hypertension Mother   . Diabetes Mother   . Heart disease Father   . Hypertension Father   . Depression Sister   . Schizophrenia Brother     Social History   Social History  . Marital Status: Single    Spouse Name: N/A  . Number of Children: N/A  . Years of Education: N/A   Occupational History  . Not on file.   Social History Main Topics  . Smoking status: Current Every Day Smoker -- 0.50 packs/day    Types: Cigarettes  . Smokeless tobacco: Not on file  . Alcohol Use: Yes     Comment: rarely  . Drug Use: No  . Sexual Activity: No   Other Topics Concern  . Not on file   Social History Narrative     Current outpatient prescriptions:  .  alprazolam (XANAX) 2 MG tablet, Take 2 mg by mouth 2 (two) times daily. Reported on 11/02/2015, Disp: , Rfl:  .  amLODipine (NORVASC) 10 MG tablet, Take 10 mg by mouth daily., Disp: , Rfl:  .  meloxicam (MOBIC) 7.5 MG tablet, Take 1 tablet (7.5 mg total) by mouth 2 (two) times daily  after a meal., Disp: 42 tablet, Rfl: 1 .  oxyCODONE (OXY IR/ROXICODONE) 5 MG immediate release tablet, Take 1 tablet (5 mg total) by mouth 2 (two) times daily after a meal., Disp: 45 tablet, Rfl: 0 .  pregabalin (LYRICA) 100 MG capsule, Take 100 mg by mouth 2 (two) times daily. Reported on 11/02/2015, Disp: , Rfl:  .  risperiDONE (RISPERDAL) 2 MG tablet, Take 2 mg by mouth 2 (two) times daily. Reported on 09/13/2015, Disp: , Rfl:  .  tiZANidine (ZANAFLEX) 2 MG tablet, Take 1 tablet (2 mg total) by mouth every 8 (eight) hours as needed for muscle spasms., Disp: 45 tablet, Rfl: 0 .  venlafaxine XR (EFFEXOR-XR) 150 MG 24 hr capsule, Take 150 mg by mouth daily., Disp: , Rfl: 0 .  zolpidem (AMBIEN) 10 MG tablet, Take 10 mg by mouth at bedtime as needed for sleep. Reported on 09/13/2015, Disp: , Rfl:   Current facility-administered medications:  .  bupivacaine (PF) (MARCAINE) 0.25 % injection 20 mL, 20 mL, Infiltration, Once, Tod Persia, MD .  fentaNYL (SUBLIMAZE) injection 50 mcg, 50 mcg,  Intravenous, UD, Tod PersiaWinston Parris, MD .  iopamidol (ISOVUE-M) 41 % intrathecal injection 20 mL, 20 mL, Intrathecal, PRN, Tod PersiaWinston Parris, MD .  midazolam (VERSED) 5 MG/5ML injection 1 mg, 1 mg, Intravenous, UD, Tod PersiaWinston Parris, MD .  triamcinolone acetonide [facility](KENALOG-40) injection 80 mg, 80 mg, Intramuscular, Once, Tod PersiaWinston Parris, MD  Allergies  Allergen Reactions  . Aspirin Nausea And Vomiting  . Trazodone And Nefazodone Swelling     Review of Systems  Constitutional: Negative for fever and chills.  HENT: Positive for congestion. Negative for sore throat.   Respiratory: Positive for cough.   Cardiovascular: Positive for chest pain.    Objective  Filed Vitals:   11/26/15 0949  BP: 141/80  Pulse: 113  Temp: 99.2 F (37.3 C)  TempSrc: Oral  Resp: 19  Height: 5\' 7"  (1.702 m)  Weight: 253 lb 8 oz (114.987 kg)  SpO2: 96%    Physical Exam  Constitutional: She is well-developed, well-nourished, and in no  distress.  HENT:  Head: Normocephalic and atraumatic.  Nose: Right sinus exhibits maxillary sinus tenderness. Left sinus exhibits maxillary sinus tenderness.  Mouth/Throat: No posterior oropharyngeal erythema.  Bilateral Turbinate hypertrophy.  Cardiovascular: Regular rhythm, S1 normal and S2 normal.  Tachycardia present.   Pulmonary/Chest: She has no decreased breath sounds. She has wheezes. She has no rhonchi.  Diffuse expiratory soft wheezes bilaterally,  Nursing note and vitals reviewed.   Assessment & Plan  1. Acute non-recurrent maxillary sinusitis Will benefit from antibiotic therapy. - azithromycin (ZITHROMAX) 250 MG tablet; 2 tabs po x day 1, then 1 tab po q day x 4 days  Dispense: 6 tablet; Refill: 0  2. Persistent dry cough Likely secondary to URI. Advised to take Tessalon 3 times daily. - benzonatate (TESSALON) 200 MG capsule; Take 1 capsule (200 mg total) by mouth 3 (three) times daily as needed for cough.  Dispense: 20 capsule; Refill: 0  3. Expiratory wheezing Again likely associated with URI, not a candidate for short acting beta agonist therapy because of tachycardia. Antibiotics should help clear her symptoms. If persistent, will need a chest x-ray.  4. Tachycardia with 100 - 120 beats per minute EKG is normal sinus rhythm, heart rate of 84 bpm, obtain laboratory workup to rule out causes of tachycardia - CBC with Differential - Comprehensive Metabolic Panel (CMET) - TSH - EKG 12-Lead   Verl Kitson Asad A. Faylene KurtzShah [facility] 11/26/2015 10:23 AM

## 2015-11-27 LAB — COMPREHENSIVE METABOLIC PANEL
ALT: 27 IU/L (ref 0–32)
AST: 20 IU/L (ref 0–40)
Albumin/Globulin Ratio: 1.3 (ref 1.2–2.2)
Albumin: 4 g/dL (ref 3.5–5.5)
Alkaline Phosphatase: 77 IU/L (ref 39–117)
BUN/Creatinine Ratio: 14 (ref 9–23)
BUN: 10 mg/dL (ref 6–24)
Bilirubin Total: 0.2 mg/dL (ref 0.0–1.2)
CALCIUM: 9.4 mg/dL (ref 8.7–10.2)
CHLORIDE: 102 mmol/L (ref 96–106)
CO2: 27 mmol/L (ref 18–29)
Creatinine, Ser: 0.74 mg/dL (ref 0.57–1.00)
GFR calc Af Amer: 112 mL/min/{1.73_m2} (ref >59)
GFR, EST NON AFRICAN AMERICAN: 97 mL/min/{1.73_m2} (ref >59)
GLUCOSE: 97 mg/dL (ref 65–99)
Globulin, Total: 3.1 g/dL (ref 1.5–4.5)
POTASSIUM: 4.7 mmol/L (ref 3.5–5.2)
Sodium: 141 mmol/L (ref 134–144)
TOTAL PROTEIN: 7.1 g/dL (ref 6.0–8.5)

## 2015-11-27 LAB — CBC WITH DIFFERENTIAL/PLATELET
BASOS ABS: 0 10*3/uL (ref 0.0–0.2)
BASOS: 0 %
EOS (ABSOLUTE): 0.1 10*3/uL (ref 0.0–0.4)
Eos: 2 %
Hematocrit: 42.4 % (ref 34.0–46.6)
Hemoglobin: 14.3 g/dL (ref 11.1–15.9)
IMMATURE GRANS (ABS): 0 10*3/uL (ref 0.0–0.1)
IMMATURE GRANULOCYTES: 0 %
LYMPHS: 34 %
Lymphocytes Absolute: 3.1 10*3/uL (ref 0.7–3.1)
MCH: 29.4 pg (ref 26.6–33.0)
MCHC: 33.7 g/dL (ref 31.5–35.7)
MCV: 87 fL (ref 79–97)
MONOS ABS: 0.8 10*3/uL (ref 0.1–0.9)
Monocytes: 9 %
NEUTROS PCT: 55 %
Neutrophils Absolute: 5 10*3/uL (ref 1.4–7.0)
PLATELETS: 268 10*3/uL (ref 150–379)
RBC: 4.86 x10E6/uL (ref 3.77–5.28)
RDW: 14.2 % (ref 12.3–15.4)
WBC: 9.1 10*3/uL (ref 3.4–10.8)

## 2015-11-27 LAB — TSH: TSH: 0.708 u[IU]/mL (ref 0.450–4.500)

## 2015-11-30 ENCOUNTER — Ambulatory Visit: Payer: Medicaid Other | Admitting: Dietician

## 2015-12-03 ENCOUNTER — Other Ambulatory Visit: Payer: Self-pay | Admitting: Family Medicine

## 2015-12-03 ENCOUNTER — Other Ambulatory Visit: Payer: Self-pay | Admitting: *Deleted

## 2015-12-03 ENCOUNTER — Telehealth: Payer: Self-pay | Admitting: Anesthesiology

## 2015-12-03 DIAGNOSIS — M25511 Pain in right shoulder: Principal | ICD-10-CM

## 2015-12-03 DIAGNOSIS — G8929 Other chronic pain: Secondary | ICD-10-CM

## 2015-12-03 MED ORDER — MELOXICAM 7.5 MG PO TABS: 7.5000 mg | ORAL_TABLET | Freq: Two times a day (BID) | ORAL | Status: AC

## 2015-12-03 MED ORDER — OXYCODONE HCL 5 MG PO TABS: 5.0000 mg | ORAL_TABLET | Freq: Two times a day (BID) | ORAL | Status: DC

## 2015-12-03 NOTE — Telephone Encounter (Signed)
Pt needs a refill on Lyrica she also states it will need a prior authorization to be done as well.. [facility] [address]

## 2015-12-03 NOTE — Telephone Encounter (Signed)
Needs meds refill enough to last until next appt and also wants to increase meds as the dose she is on is not enough to help pain. Will be up here today

## 2015-12-03 NOTE — Telephone Encounter (Signed)
Dr Starling MannsParris notified.

## 2015-12-28 ENCOUNTER — Ambulatory Visit: Payer: Medicaid Other | Admitting: Family Medicine

## 2015-12-30 ENCOUNTER — Telehealth: Payer: Self-pay | Admitting: Family Medicine

## 2015-12-30 ENCOUNTER — Ambulatory Visit (INDEPENDENT_AMBULATORY_CARE_PROVIDER_SITE_OTHER): Payer: Medicaid Other | Admitting: Family Medicine

## 2015-12-30 ENCOUNTER — Ambulatory Visit: Payer: Medicaid Other

## 2015-12-30 ENCOUNTER — Encounter: Payer: Self-pay | Admitting: Family Medicine

## 2015-12-30 VITALS — BP 138/73 | HR 117 | Temp 98.2°F | Resp 18 | Ht 67.0 in | Wt 265.8 lb

## 2015-12-30 DIAGNOSIS — Z8619 Personal history of other infectious and parasitic diseases: Secondary | ICD-10-CM | POA: Insufficient documentation

## 2015-12-30 DIAGNOSIS — B1920 Unspecified viral hepatitis C without hepatic coma: Secondary | ICD-10-CM | POA: Diagnosis not present

## 2015-12-30 DIAGNOSIS — M7989 Other specified soft tissue disorders: Secondary | ICD-10-CM | POA: Diagnosis not present

## 2015-12-30 NOTE — Telephone Encounter (Signed)
Megan from Ultrasound wanted to inform you that patient was placed on schedule for today for STAT and patient never showed.  (call Aundra MilletMegan on this number first) [phone] (call this number second) [phone]

## 2015-12-30 NOTE — Progress Notes (Signed)
Name: Diamond Ballard   MRN: [id number]    DOB: 11/13/1970   Date:12/30/2015       Progress Note  Subjective  Chief Complaint  Chief Complaint  Patient presents with  . Follow-up    1 mo    HPI  Pt. Is here to follow up on Hepatitis C. She was diagnosed 3 years ago by [place], never received any treatment because of insurance difficulties. Records from PCP not available. She wants to be tested for hepatitis C again.  In addition, patient complains of lower extremity swelling, started after she traveled to [place], denies any chest pains but reports that her leg swelling is not relieved after the trip.    Past Medical History  Diagnosis Date  . Hypertension   . Scoliosis   . Bulging lumbar disc   . Neuropathy (HCC)   . Arthritis     in Lower back  . Schizoaffective disorder (HCC)   . Depression   . Anxiety   . Insomnia     Past Surgical History  Procedure Laterality Date  . Ectopic pregnancy surgery  1992  . Knee arthroscopy Right 1998  . Eye surgery    . Hernia repair  2009  . Ovarian cyst removal Right 1994    Family History  Problem Relation Age of Onset  . Hypertension Mother   . Diabetes Mother   . Heart disease Father   . Hypertension Father   . Depression Sister   . Schizophrenia Brother     Social History   Social History  . Marital Status: Single    Spouse Name: N/A  . Number of Children: N/A  . Years of Education: N/A   Occupational History  . Not on file.   Social History Main Topics  . Smoking status: Current Every Day Smoker -- 0.50 packs/day    Types: Cigarettes  . Smokeless tobacco: Not on file  . Alcohol Use: Yes     Comment: rarely  . Drug Use: No  . Sexual Activity: No   Other Topics Concern  . Not on file   Social History Narrative     Current outpatient prescriptions:  .  alprazolam (XANAX) 2 MG tablet, Take 2 mg by mouth 2 (two) times daily. Reported on 11/02/2015, Disp: , Rfl:  .  amLODipine  (NORVASC) 10 MG tablet, Take 10 mg by mouth daily., Disp: , Rfl:  .  cloNIDine (CATAPRES) 0.1 MG tablet, Take 0.1 mg by mouth 3 (three) times daily., Disp: , Rfl: 0 .  hydrOXYzine (VISTARIL) 25 MG capsule, Take 50 mg by mouth at bedtime., Disp: , Rfl: 0 .  meloxicam (MOBIC) 7.5 MG tablet, Take 1 tablet (7.5 mg total) by mouth 2 (two) times daily after a meal., Disp: 42 tablet, Rfl: 1 .  oxyCODONE (OXY IR/ROXICODONE) 5 MG immediate release tablet, Take 1 tablet (5 mg total) by mouth 2 (two) times daily after a meal., Disp: 45 tablet, Rfl: 0 .  prazosin (MINIPRESS) 2 MG capsule, TAKE 1-2 CAPSULES BY MOUTH AT BEDTIME, Disp: , Rfl: 0 .  pregabalin (LYRICA) 100 MG capsule, Take 100 mg by mouth 2 (two) times daily. Reported on 11/02/2015, Disp: , Rfl:  .  risperiDONE (RISPERDAL) 2 MG tablet, Take 2 mg by mouth 2 (two) times daily. Reported on 09/13/2015, Disp: , Rfl:  .  tiZANidine (ZANAFLEX) 2 MG tablet, Take 1 tablet (2 mg total) by mouth every 8 (eight) hours as needed for muscle spasms.,  Disp: 45 tablet, Rfl: 0 .  venlafaxine XR (EFFEXOR-XR) 150 MG 24 hr capsule, Take 150 mg by mouth daily., Disp: , Rfl: 0  Current facility-administered medications:  .  bupivacaine (PF) (MARCAINE) 0.25 % injection 20 mL, 20 mL, Infiltration, Once, Tod Persia, MD .  fentaNYL (SUBLIMAZE) injection 50 mcg, 50 mcg, Intravenous, UD, Tod Persia, MD .  iopamidol (ISOVUE-M) 41 % intrathecal injection 20 mL, 20 mL, Intrathecal, PRN, Tod Persia, MD .  midazolam (VERSED) 5 MG/5ML injection 1 mg, 1 mg, Intravenous, UD, Tod Persia, MD .  triamcinolone acetonide (KENALOG-40) injection 80 mg, 80 mg, Intramuscular, Once, Tod Persia, MD  Allergies  Allergen Reactions  . Aspirin Nausea And Vomiting  . Trazodone And Nefazodone Swelling     Review of Systems  Constitutional: Negative for fever and chills.  Respiratory: Negative for shortness of breath.   Cardiovascular: Positive for leg swelling.   Gastrointestinal: Negative for nausea, vomiting and abdominal pain.      Objective  Filed Vitals:   12/30/15 1001  BP: 138/73  Pulse: 117  Temp: 98.2 F (36.8 C)  TempSrc: Oral  Resp: 18  Height:  (1.702 m)  Weight: 265 lb 12.8 oz (120.566 kg)  SpO2: 96%    Physical Exam  Constitutional: She is oriented to person, place, and time and well-developed, well-nourished, and in no distress.  HENT:  Head: Normocephalic and atraumatic.  Cardiovascular: Normal rate and regular rhythm.   Pulmonary/Chest: Effort normal and breath sounds normal.  Abdominal: Soft. Bowel sounds are normal.  Musculoskeletal:       Right ankle: She exhibits swelling.       Left ankle: She exhibits swelling.  bilateral 2+ pitting edema.  Neurological: She is alert and oriented to person, place, and time.  Nursing note and vitals reviewed.    Assessment & Plan  1. History of hepatitis C virus infection Will obtain hepatitis antibody, RNA and HIV to rule out coexisting infection. Follow-up after lab work - Hepatitis C Antibody - Hepatitis C RNA quantitative - HIV antibody (with reflex)  2. Leg swelling Activity dependent edema, however given history of long immobilization (car travel to [place]), we will rule out DVT, obtain pertinent ultrasound and follow-up. - VAS [place] LOWER EXTREMITY VENOUS (DVT); Future  Atlee Villers [facility] 12/30/2015 10:17 AM

## 2015-12-30 NOTE — Telephone Encounter (Signed)
Please contact patient to find out why she did not show up for appointment for ultrasound. Thank you

## 2015-12-31 ENCOUNTER — Encounter: Payer: Self-pay | Admitting: Anesthesiology

## 2015-12-31 ENCOUNTER — Ambulatory Visit: Payer: Medicaid Other | Attending: Anesthesiology | Admitting: Anesthesiology

## 2015-12-31 VITALS — BP 126/93 | HR 90 | Temp 97.5°F | Resp 12 | Ht 68.0 in | Wt 265.0 lb

## 2015-12-31 DIAGNOSIS — M545 Low back pain, unspecified: Secondary | ICD-10-CM

## 2015-12-31 DIAGNOSIS — M5416 Radiculopathy, lumbar region: Secondary | ICD-10-CM

## 2015-12-31 DIAGNOSIS — M5137 Other intervertebral disc degeneration, lumbosacral region: Secondary | ICD-10-CM | POA: Diagnosis not present

## 2015-12-31 DIAGNOSIS — G8929 Other chronic pain: Secondary | ICD-10-CM | POA: Diagnosis not present

## 2015-12-31 DIAGNOSIS — M5116 Intervertebral disc disorders with radiculopathy, lumbar region: Secondary | ICD-10-CM | POA: Diagnosis not present

## 2015-12-31 DIAGNOSIS — M25511 Pain in right shoulder: Secondary | ICD-10-CM

## 2015-12-31 MED ORDER — IOPAMIDOL (ISOVUE-M 200) INJECTION 41%: 20.0000 mL | INTRAMUSCULAR | Status: AC | PRN

## 2015-12-31 MED ORDER — MIDAZOLAM HCL 5 MG/5ML IJ SOLN
INTRAMUSCULAR | Status: AC
  Administered 2015-12-31: 2 mg
  Filled 2015-12-31: qty 5

## 2015-12-31 MED ORDER — IOPAMIDOL (ISOVUE-M 200) INJECTION 41%
INTRAMUSCULAR | Status: AC
Start: 2015-12-31 — End: 2015-12-31
  Administered 2015-12-31: 12:00:00
  Filled 2015-12-31: qty 10

## 2015-12-31 MED ORDER — FENTANYL CITRATE (PF) 100 MCG/2ML IJ SOLN
INTRAMUSCULAR | Status: AC
  Administered 2015-12-31: 50 ug
  Filled 2015-12-31: qty 2

## 2015-12-31 MED ORDER — OXYCODONE HCL 5 MG PO TABS: 5.0000 mg | ORAL_TABLET | Freq: Two times a day (BID) | ORAL | Status: DC

## 2015-12-31 MED ORDER — BUPIVACAINE HCL (PF) 0.25 % IJ SOLN
INTRAMUSCULAR | Status: AC
  Administered 2015-12-31: 12:00:00
  Filled 2015-12-31: qty 30

## 2015-12-31 MED ORDER — TRIAMCINOLONE ACETONIDE 40 MG/ML IJ SUSP
INTRAMUSCULAR | Status: AC
  Administered 2015-12-31: 80 mg
  Filled 2015-12-31: qty 2

## 2015-12-31 NOTE — Telephone Encounter (Signed)
Left voice message for patient to return call. °

## 2015-12-31 NOTE — Patient Instructions (Signed)
GENERAL RISKS AND COMPLICATIONS  What are the risk, side effects and possible complications? Generally speaking, most procedures are safe.  However, with any procedure there are risks, side effects, and the possibility of complications.  The risks and complications are dependent upon the sites that are lesioned, or the type of nerve block to be performed.  The closer the procedure is to the spine, the more serious the risks are.  Great care is taken when placing the radio frequency needles, block needles or lesioning probes, but sometimes complications can occur. 1. Infection: Any time there is an injection through the skin, there is a risk of infection.  This is why sterile conditions are used for these blocks.  There are four possible types of infection. 1. Localized skin infection. 2. Central Nervous System Infection-This can be in the form of Meningitis, which can be deadly. 3. Epidural Infections-This can be in the form of an epidural abscess, which can cause pressure inside of the spine, causing compression of the spinal cord with subsequent paralysis. This would require an emergency surgery to decompress, and there are no guarantees that the patient would recover from the paralysis. 4. Discitis-This is an infection of the intervertebral discs.  It occurs in about 1% of discography procedures.  It is difficult to treat and it may lead to surgery.        2. Pain: the needles have to go through skin and soft tissues, will cause soreness.       3. Damage to internal structures:  The nerves to be lesioned may be near blood vessels or    other nerves which can be potentially damaged.       4. Bleeding: Bleeding is more common if the patient is taking blood thinners such as  aspirin, Coumadin, Ticiid, Plavix, etc., or if he/she have some genetic predisposition  such as hemophilia. Bleeding into the spinal canal can cause compression of the spinal  cord with subsequent paralysis.  This would require an  emergency surgery to  decompress and there are no guarantees that the patient would recover from the  paralysis.       5. Pneumothorax:  Puncturing of a lung is a possibility, every time a needle is introduced in  the area of the chest or upper back.  Pneumothorax refers to free air around the  collapsed lung(s), inside of the thoracic cavity (chest cavity).  Another two possible  complications related to a similar event would include: Hemothorax and Chylothorax.   These are variations of the Pneumothorax, where instead of air around the collapsed  lung(s), you may have blood or chyle, respectively.       6. Spinal headaches: They may occur with any procedures in the area of the spine.       7. Persistent CSF (Cerebro-Spinal Fluid) leakage: This is a rare problem, but may occur  with prolonged intrathecal or epidural catheters either due to the formation of a fistulous  track or a dural tear.       8. Nerve damage: By working so close to the spinal cord, there is always a possibility of  nerve damage, which could be as serious as a permanent spinal cord injury with  paralysis.       9. Death:  Although rare, severe deadly allergic reactions known as "Anaphylactic  reaction" can occur to any of the medications used.      10. Worsening of the symptoms:  We can always make thing worse.    What are the chances of something like this happening? Chances of any of this occuring are extremely low.  By statistics, you have more of a chance of getting killed in a motor vehicle accident: while driving to the hospital than any of the above occurring .  Nevertheless, you should be aware that they are possibilities.  In general, it is similar to taking a shower.  Everybody knows that you can slip, hit your head and get killed.  Does that mean that you should not shower again?  Nevertheless always keep in mind that statistics do not mean anything if you happen to be on the wrong side of them.  Even if a procedure has a 1  (one) in a 1,000,000 (million) chance of going wrong, it you happen to be that one..Also, keep in mind that by statistics, you have more of a chance of having something go wrong when taking medications.  Who should not have this procedure? If you are on a blood thinning medication (e.g. Coumadin, Plavix, see list of "Blood Thinners"), or if you have an active infection going on, you should not have the procedure.  If you are taking any blood thinners, please inform your physician.  How should I prepare for this procedure?  Do not eat or drink anything at least six hours prior to the procedure.  Bring a driver with you .  It cannot be a taxi.  Come accompanied by an adult that can drive you back, and that is strong enough to help you if your legs get weak or numb from the local anesthetic.  Take all of your medicines the morning of the procedure with just enough water to swallow them.  If you have diabetes, make sure that you are scheduled to have your procedure done first thing in the morning, whenever possible.  If you have diabetes, take only half of your insulin dose and notify our nurse that you have done so as soon as you arrive at the clinic.  If you are diabetic, but only take blood sugar pills (oral hypoglycemic), then do not take them on the morning of your procedure.  You may take them after you have had the procedure.  Do not take aspirin or any aspirin-containing medications, at least eleven (11) days prior to the procedure.  They may prolong bleeding.  Wear loose fitting clothing that may be easy to take off and that you would not mind if it got stained with Betadine or blood.  Do not wear any jewelry or perfume  Remove any nail coloring.  It will interfere with some of our monitoring equipment.  NOTE: Remember that this is not meant to be interpreted as a complete list of all possible complications.  Unforeseen problems may occur.  BLOOD THINNERS The following drugs  contain aspirin or other products, which can cause increased bleeding during surgery and should not be taken for 2 weeks prior to and 1 week after surgery.  If you should need take something for relief of minor pain, you may take acetaminophen which is found in Tylenol,m Datril, Anacin-3 and Panadol. It is not blood thinner. The products listed below are.  Do not take any of the products listed below in addition to any listed on your instruction sheet.  A.P.C or A.P.C with Codeine Codeine Phosphate Capsules #3 Ibuprofen Ridaura  ABC compound Congesprin Imuran rimadil  Advil Cope Indocin Robaxisal  Alka-Seltzer Effervescent Pain Reliever and Antacid Coricidin or Coricidin-D  Indomethacin Rufen    Alka-Seltzer plus Cold Medicine Cosprin Ketoprofen S-A-C Tablets  Anacin Analgesic Tablets or Capsules Coumadin Korlgesic Salflex  Anacin Extra Strength Analgesic tablets or capsules CP-2 Tablets Lanoril Salicylate  Anaprox Cuprimine Capsules Levenox Salocol  Anexsia-D Dalteparin Magan Salsalate  Anodynos Darvon compound Magnesium Salicylate Sine-off  Ansaid Dasin Capsules Magsal Sodium Salicylate  Anturane Depen Capsules Marnal Soma  APF Arthritis pain formula Dewitt's Pills Measurin Stanback  Argesic Dia-Gesic Meclofenamic Sulfinpyrazone  Arthritis Bayer Timed Release Aspirin Diclofenac Meclomen Sulindac  Arthritis pain formula Anacin Dicumarol Medipren Supac  Analgesic (Safety coated) Arthralgen Diffunasal Mefanamic Suprofen  Arthritis Strength Bufferin Dihydrocodeine Mepro Compound Suprol  Arthropan liquid Dopirydamole Methcarbomol with Aspirin Synalgos  ASA tablets/Enseals Disalcid Micrainin Tagament  Ascriptin Doan's Midol Talwin  Ascriptin A/D Dolene Mobidin Tanderil  Ascriptin Extra Strength Dolobid Moblgesic Ticlid  Ascriptin with Codeine Doloprin or Doloprin with Codeine Momentum Tolectin  Asperbuf Duoprin Mono-gesic Trendar  Aspergum Duradyne Motrin or Motrin IB Triminicin  Aspirin  plain, buffered or enteric coated Durasal Myochrisine Trigesic  Aspirin Suppositories Easprin Nalfon Trillsate  Aspirin with Codeine Ecotrin Regular or Extra Strength Naprosyn Uracel  Atromid-S Efficin Naproxen Ursinus  Auranofin Capsules Elmiron Neocylate Vanquish  Axotal Emagrin Norgesic Verin  Azathioprine Empirin or Empirin with Codeine Normiflo Vitamin E  Azolid Emprazil Nuprin Voltaren  Bayer Aspirin plain, buffered or children's or timed BC Tablets or powders Encaprin Orgaran Warfarin Sodium  Buff-a-Comp Enoxaparin Orudis Zorpin  Buff-a-Comp with Codeine Equegesic Os-Cal-Gesic   Buffaprin Excedrin plain, buffered or Extra Strength Oxalid   Bufferin Arthritis Strength Feldene Oxphenbutazone   Bufferin plain or Extra Strength Feldene Capsules Oxycodone with Aspirin   Bufferin with Codeine Fenoprofen Fenoprofen Pabalate or Pabalate-SF   Buffets II Flogesic Panagesic   Buffinol plain or Extra Strength Florinal or Florinal with Codeine Panwarfarin   Buf-Tabs Flurbiprofen Penicillamine   Butalbital Compound Four-way cold tablets Penicillin   Butazolidin Fragmin Pepto-Bismol   Carbenicillin Geminisyn Percodan   Carna Arthritis Reliever Geopen Persantine   Carprofen Gold's salt Persistin   Chloramphenicol Goody's Phenylbutazone   Chloromycetin Haltrain Piroxlcam   Clmetidine heparin Plaquenil   Cllnoril Hyco-pap Ponstel   Clofibrate Hydroxy chloroquine Propoxyphen         Before stopping any of these medications, be sure to consult the physician who ordered them.  Some, such as Coumadin (Warfarin) are ordered to prevent or treat serious conditions such as "deep thrombosis", "pumonary embolisms", and other heart problems.  The amount of time that you may need off of the medication may also vary with the medication and the reason for which you were taking it.  If you are taking any of these medications, please make sure you notify your pain physician before you undergo any  procedures.         Pain Management Discharge Instructions  General Discharge Instructions :  If you need to reach your doctor call: Monday-Friday 8:00 am - 4:00 pm at [phone] or toll free [phone].  After clinic hours [phone] to have operator reach doctor.  Bring all of your medication bottles to all your appointments in the pain clinic.  To cancel or reschedule your appointment with Pain Management please remember to call 24 hours in advance to avoid a fee.  Refer to the educational materials which you have been given on: General Risks, I had my Procedure. Discharge Instructions, Post Sedation.  Post Procedure Instructions:  The drugs you were given will stay in your system until tomorrow, so for the next 24 hours you should   not drive, make any legal decisions or drink any alcoholic beverages.  You may eat anything you prefer, but it is better to start with liquids then soups and crackers, and gradually work up to solid foods.  Please notify your doctor immediately if you have any unusual bleeding, trouble breathing or pain that is not related to your normal pain.  Depending on the type of procedure that was done, some parts of your body may feel week and/or numb.  This usually clears up by tonight or the next day.  Walk with the use of an assistive device or accompanied by an adult for the 24 hours.  You may use ice on the affected area for the first 24 hours.  Put ice in a Ziploc bag and cover with a towel and place against area 15 minutes on 15 minutes off.  You may switch to heat after 24 hours. 

## 2015-12-31 NOTE — Progress Notes (Signed)
Safety precautions to be maintained throughout the outpatient stay will include: orient to surroundings, keep bed in low position, maintain call bell within reach at all times, provide assistance with transfer out of bed and ambulation.  

## 2015-12-31 NOTE — Procedures (Signed)
Date of procedure:  12/31/2015  Preoperative Diagnosis:  1 chronic low back pain 2 lumbar degenerative disc disease Ray lumbar radiculopathy  Postoperative Diagnosis:  Same.  Procedure: 1. Caudal epidural steroid injection, 2. Epidural with interpretation. 3. Fluoroscopic guidance.  Surgeon: Tod PersiaWinston Patt Steinhardt, MD  Anesthesia: MAC anesthesia by the nurse and staff under my direction  Informed consent was obtained and the patient appeared to accept and understand the benefits and risks of this procedure.   Pre procedure comments:  None  Description of the Procedure:  The patient was taken to the operating room and placed in the prone position.  Intravenous sedation and MAC anesthesia was administered by the nurse and staff under my direction. After appropriate sedation, the sacrococcygeal area was prepped with Betadine.  After adequate draping, the area between the sacral cornu was palpated and infiltrated with 3 cc of 1% Lidocaine.   An AP fluoroscopic view of the sacrum was visualized and a 17 gauge Tuohy needle was inserted in the midline at the angle of 45 degrees through the sacrococcygeal membrane.  After making contact with the bone, the needle was withdrawn and readvanced in horizontal position, into the caudal epidural space.  Epidurogram Study: One cc of Omnipaque 200 was injected through the needle and epidurogram was visualized in both the later and AP views. 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  The needle was removed and adequate hemostasis was established.    The patient tolerated the procedure quite well and vital signs were stable.  There were no adverse effects.  Additional comments:    The patient was taken to the recovery room in satisfactory condition where the patient was observed and subsequently discharged home.  The patient was given Roxicodone 5 mg twice a day when necessary and she was given 60 tablets  Will follow up in the clinic in the next week.  Tod PersiaWinston Regan Mcbryar M.D.

## 2016-01-03 ENCOUNTER — Telehealth: Payer: Self-pay | Admitting: *Deleted

## 2016-01-03 LAB — HIV ANTIBODY (ROUTINE TESTING W REFLEX): HIV Screen 4th Generation wRfx: NONREACTIVE

## 2016-01-03 LAB — HEPATITIS C ANTIBODY

## 2016-01-03 LAB — HCV RNA QUANT RFLX ULTRA OR GENOTYP
HCV LOG10: 6.29 {Log_IU}/mL
HCV Quant Baseline: 1950000 IU/mL

## 2016-01-03 LAB — HEPATITIS C GENOTYPE

## 2016-01-03 NOTE — Telephone Encounter (Signed)
Please reschedule the appointment for ultrasound of lower extremities to rule out DVT as soon as possible.

## 2016-01-03 NOTE — Telephone Encounter (Signed)
Read messages below: Please reschedule appointment but it will have to be at least 4 days out due to transportation. Transportation only takes her to one appointment at a time and they have to have a few days notice. Thank you

## 2016-01-03 NOTE — Telephone Encounter (Signed)
Spoke with patient, verbalizes that her thighs are sore and her tailbone is very sensitive.  Explained to patient that the sensitivity is normal and will get better.  Also, asked the patient if she has been drinking plenty of fluids and staying well hydrated.  Patient states that she has been drinking some water not a lot.  Encouraged patient to drink so that she is not dehydrated and muscle spasms do not contribute to her pain.  Also explained that it will take sometime for the steroids to start working.  Patient verbalizes u/o information.

## 2016-01-03 NOTE — Telephone Encounter (Signed)
[facility] for her to make the appointment.

## 2016-01-07 ENCOUNTER — Encounter: Payer: Medicaid Other | Attending: Family Medicine | Admitting: Dietician

## 2016-01-07 ENCOUNTER — Encounter: Payer: Self-pay | Admitting: Dietician

## 2016-01-07 VITALS — Ht 67.5 in | Wt 254.3 lb

## 2016-01-07 DIAGNOSIS — E669 Obesity, unspecified: Secondary | ICD-10-CM

## 2016-01-07 DIAGNOSIS — E119 Type 2 diabetes mellitus without complications: Secondary | ICD-10-CM | POA: Diagnosis present

## 2016-01-07 NOTE — Progress Notes (Signed)
Medical Nutrition Therapy: Visit start time: 1030  end time: 1130  Assessment:  Diagnosis: obesity Past medical history: Diabetes, HTN, GI reflux Psychosocial issues/ stress concerns: bi-polar disorder; reports high stress level Preferred learning method:  . Visual  Current weight: 254.3lbs  Height: 5'7.5" Medications, supplements: reviewed list in chart with patient  Progress and evaluation: Patient reports chronic back pain, which has affected her activity level; and more idle time has led to increased eating.          She reports weight gain over the past several years.         She states she is found it difficult to get and stay motivated to work on lifestyle changes, does not feel supported by her family.   Physical activity: none currently; was going to the gym with her cousin   Dietary Intake:  Usual eating pattern includes 2-3 meals and 2-3 snacks per day. Dining out frequency: 1 meals per week.  Breakfast: sometimes instant oatmeal and coffee, sometimes just coffee. Difficult to eat first thing in am Snack: has been eating some snacks in effort to avoid smoking; graham crackers, swiss cake rolls Lunch: fries; usually baked tilapia and yellow rice. Not able to cook recently due to back. Limited finances, food stamps Snack: none, other than a mint or gum Supper: tilapia and rice or sometimes potato, spaghetti Snack: sometimes snack cake or donut Beverages: coffee with sugar and creamer 1-2 times a day, water sometimes with flavoring, occasional soda, not often  Nutrition Care Education: Topics covered: weight management, diabetes, hypertension Basic nutrition: appropriate nutrient balance, appropriate meal and snack schedule   Weight control: benefits of weight control, behavioral changes for weight loss: plate method for planning balanced meals Advanced nutrition: cooking techniques, low-cost food and meal options, healthy snack options Diabetes: appropriate meal and snack  schedule, appropriate carb intake and balance: limiting starch portions and added sugars Hypertension:  identifying high sodium foods, identifying food sources of potassium, magnesium Other lifestyle changes: benefits of regular exercise and low-cost options. Discussed self-motivation.   Nutritional Diagnosis:  [place]-3.3 Overweight/obesity As related to excess calories and inactivity due to chronic pain.  As evidenced by patient report.  Intervention: Instruction as noted above.   Set goals with patient input.    Encouraged patient to work on a few achievable goals at a time, and to reward herself for successes.      Education Materials given:  . Plate Planner . Sample menus: Quick and Healthy meal Ideas . Goals/ instructions . Other Chartered certified accountantorever Fit coupon, Home Exercise Guide  Learner/ who was taught:  . Patient   Level of understanding: Marland Kitchen. Verbalizes/ demonstrates competency  Demonstrated degree of understanding via:   Teach back Learning barriers: Marland Kitchen. Motivation lacking at times per patient  Willingness to learn/ readiness for change: . Acceptance, ready for change  Monitoring and Evaluation:  Dietary intake, exercise, and body weight      follow up: 02/11/16

## 2016-01-07 NOTE — Patient Instructions (Signed)
   Begin some exercise that is safe for you. Call the [facility] for a free week using your coupon.  Keep meals and snacks 3-4 hours apart. Choose some healthy low-cost snacks like 1/2 sandwich with peanut butter, or 1-2 graham crackers with peanut butter, a serving of fruit when affordable with a small portion of cottage cheese. A small bowl of cereal like cheerios can also be a healthy snack.   Low-cost meat substitutes are eggs, peanut butter, and beans (pintos, black beans, navy beans, etc.).

## 2016-01-13 ENCOUNTER — Ambulatory Visit: Payer: Medicaid Other

## 2016-01-14 NOTE — Addendum Note (Signed)
Addended bySherryll Burger: Leevi Cullars A A on: 01/14/2016 01:19 PM   Modules accepted: Orders

## 2016-01-20 ENCOUNTER — Telehealth: Payer: Self-pay | Admitting: Family Medicine

## 2016-01-20 NOTE — Telephone Encounter (Signed)
Patient is checking status on referral for Hep C. She will be going out of town this weekend and next weekend.

## 2016-01-20 NOTE — Telephone Encounter (Signed)
Patient has been referred to gastroenterology on 01/14/2016

## 2016-01-21 NOTE — Telephone Encounter (Signed)
Patient informed. 

## 2016-01-28 ENCOUNTER — Encounter: Payer: Self-pay | Admitting: Anesthesiology

## 2016-01-28 ENCOUNTER — Ambulatory Visit: Payer: Medicaid Other | Attending: Anesthesiology | Admitting: Anesthesiology

## 2016-01-28 VITALS — BP 123/86 | HR 111 | Temp 98.7°F | Resp 18 | Ht 68.0 in | Wt 245.0 lb

## 2016-01-28 DIAGNOSIS — M791 Myalgia: Secondary | ICD-10-CM | POA: Insufficient documentation

## 2016-01-28 DIAGNOSIS — M545 Low back pain, unspecified: Secondary | ICD-10-CM

## 2016-01-28 DIAGNOSIS — G8929 Other chronic pain: Secondary | ICD-10-CM | POA: Diagnosis present

## 2016-01-28 DIAGNOSIS — M5116 Intervertebral disc disorders with radiculopathy, lumbar region: Secondary | ICD-10-CM | POA: Diagnosis not present

## 2016-01-28 DIAGNOSIS — M5416 Radiculopathy, lumbar region: Secondary | ICD-10-CM

## 2016-01-28 DIAGNOSIS — M25511 Pain in right shoulder: Secondary | ICD-10-CM | POA: Diagnosis not present

## 2016-01-28 DIAGNOSIS — M5137 Other intervertebral disc degeneration, lumbosacral region: Secondary | ICD-10-CM

## 2016-01-28 MED ORDER — OXYCODONE HCL 5 MG PO TABS: 5.0000 mg | ORAL_TABLET | Freq: Two times a day (BID) | ORAL | Status: AC

## 2016-01-28 NOTE — Progress Notes (Signed)
Safety precautions to be maintained throughout the outpatient stay will include: orient to surroundings, keep bed in low position, maintain call bell within reach at all times, provide assistance with transfer out of bed and ambulation.  

## 2016-01-29 NOTE — Progress Notes (Signed)
   Subjective:    Patient ID: Diamond Ballard, female    DOB: 12/12/1970, 45 y.o.   MRN: [id number]  HPI  This patient return to the clinic today getting that she would like more opioid medication She indicated that she was getting much than she is getting from us here in the pain I told her that in my judgment she was receiving the appropriate amount of medication she needed after evaluating her pathophysiology  She disagreed with me and indicated that she might benefit from returning to her primary care physician   She indicated that she did not get any relief from the caudal epidural steroid injection and that opioid medication was what she  Needed. I disagreed with her premise and told her that I would refer her back to her   Physician for continued care   Review of Systems  Constitutional: Negative.   HENT: Negative.   Eyes: Negative.   Respiratory: Negative.   Cardiovascular: Negative.   Gastrointestinal: Negative.   Endocrine: Negative.   Genitourinary: Negative.   Musculoskeletal: Positive for myalgias, back pain, arthralgias and gait problem. Negative for joint swelling, neck pain and neck stiffness.  Skin: Negative.   Allergic/Immunologic: Negative.   Neurological: Negative.   Hematological: Negative.   Psychiatric/Behavioral: Negative.        Objective:   Physical Exam  Cardiovascular:  Patient appeared to be in no particular distress Her vital signs were relatively stable Blood pressure was 123/86 mmHg Pulse was 111 bpm Her respirations were 18 breaths per minute SPO2 was 99% Temperature was 98.50F There were no new neurological nor musculoskeletal findings  Nursing note and vitals reviewed.         Assessment & Plan:   Assessment 1 chronic low back pain 2 lumbar degenerative disc disease 3 lumbar radiculopathy 4 right shoulder pain    Plan of management 1 we'll continue the patient on Roxicodone 5 mg twice a day # 42 tablets 2 she is to return  to her primary care physician For continued care and pain management     Established patient        Level  3     Tod PersiaWinston Hamna Asa M.D.

## 2016-02-10 ENCOUNTER — Ambulatory Visit: Payer: Medicaid Other | Admitting: Family Medicine

## 2016-02-11 ENCOUNTER — Ambulatory Visit: Payer: Medicaid Other | Admitting: Dietician

## 2016-02-16 ENCOUNTER — Ambulatory Visit: Payer: Medicaid Other | Admitting: Family Medicine

## 2016-02-21 ENCOUNTER — Ambulatory Visit: Payer: Medicaid Other | Admitting: Family Medicine

## 2016-02-21 ENCOUNTER — Ambulatory Visit: Payer: Medicaid Other | Admitting: Gastroenterology

## 2016-02-24 ENCOUNTER — Telehealth: Payer: Self-pay

## 2016-02-24 ENCOUNTER — Ambulatory Visit: Payer: Medicaid Other | Admitting: Family Medicine

## 2016-02-24 DIAGNOSIS — M25511 Pain in right shoulder: Principal | ICD-10-CM

## 2016-02-24 DIAGNOSIS — G8929 Other chronic pain: Secondary | ICD-10-CM

## 2016-02-24 NOTE — Telephone Encounter (Signed)
Please put in a new referral to pain management for pt so that [facility] can be [facility].

## 2016-02-24 NOTE — Telephone Encounter (Signed)
Referral to pain clinic has been entered 

## 2016-02-25 NOTE — Telephone Encounter (Signed)
Referral has been faxed.

## 2016-03-01 ENCOUNTER — Ambulatory Visit: Payer: Medicaid Other | Admitting: Dietician

## 2016-03-06 ENCOUNTER — Ambulatory Visit: Payer: Medicaid Other | Admitting: Family Medicine

## 2016-03-07 ENCOUNTER — Encounter: Payer: Self-pay | Admitting: Dietician

## 2016-03-07 NOTE — Progress Notes (Signed)
Have not heard from patient to reschedule second missed appointment from 03/01/16. Sent discharge letter to MD.

## 2017-08-25 IMAGING — MR MR SHOULDER*R* W/O CM
5 series · 40 of 40 positions shown · non-contrast
Comparison: Radiographs 09/13/2015.

CLINICAL DATA: Right shoulder pain with limited range of motion for
9 months. No acute injury or prior relevant surgery.

EXAM:
MRI OF THE RIGHT SHOULDER WITHOUT CONTRAST
TECHNIQUE: Multiplanar, multisequence MR imaging of the shoulder was performed.
No intravenous contrast was administered.

[Series 5: T2 fat-sat · axial · 4.0mm · 0.47mm/px · z∈[-5,+83]mm · 8 of 21 slices shown (1 of 3)]
[im 1/21]
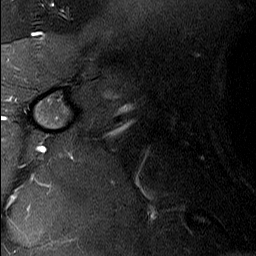
[im 3/21]
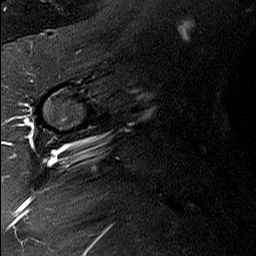
[im 6/21]
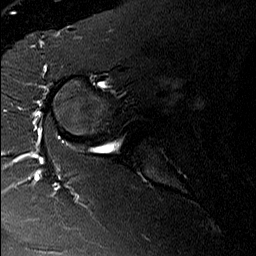
[im 9/21]
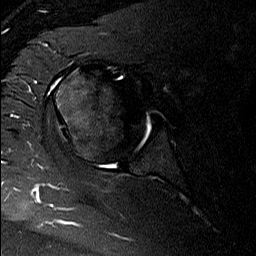
[im 12/21]
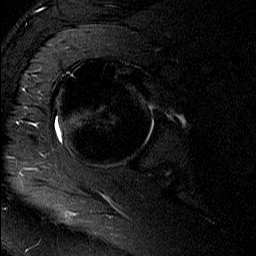
[im 15/21]
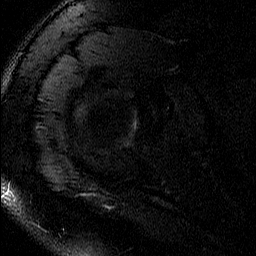
[im 18/21]
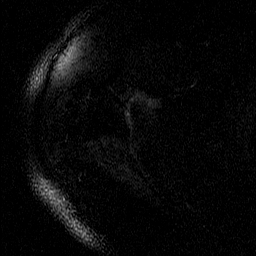
[im 21/21]
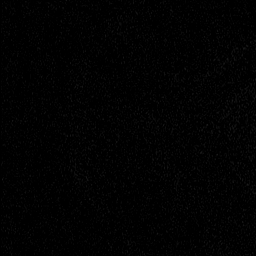

[Series 6: T2 fat-sat · oblique · 4.0mm · 0.62mm/px · 8 of 19 slices shown (2 of 3)]
[im 1/19]
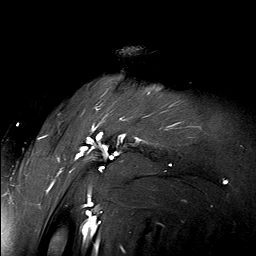
[im 3/19]
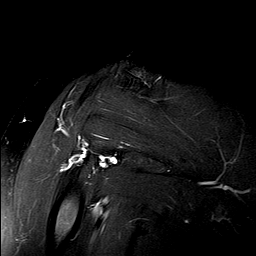
[im 6/19]
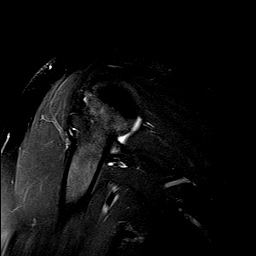
[im 8/19]
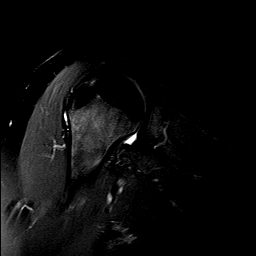
[im 11/19]
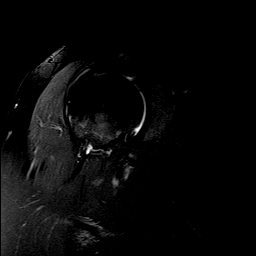
[im 13/19]
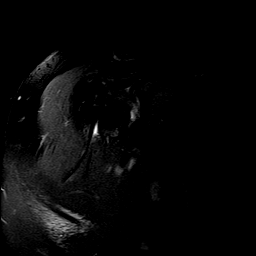
[im 16/19]
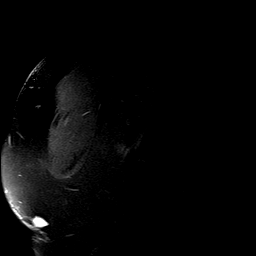
[im 19/19]
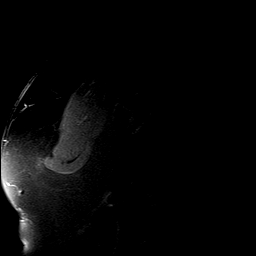

[Series 7: PD · oblique · 4.0mm · 0.62mm/px · 8 of 19 slices shown]
[im 1/19]
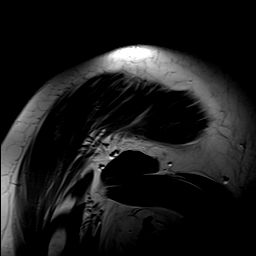
[im 3/19]
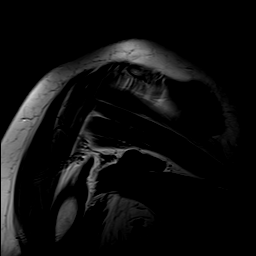
[im 6/19]
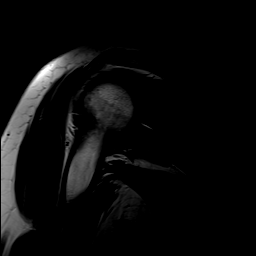
[im 8/19]
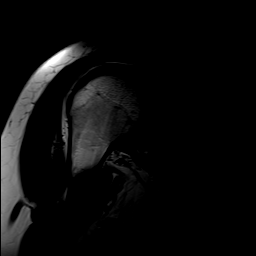
[im 11/19]
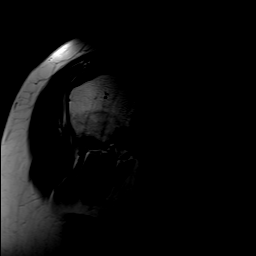
[im 13/19]
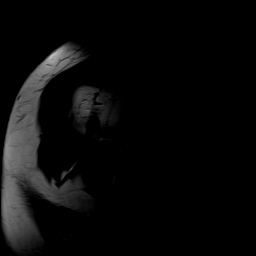
[im 16/19]
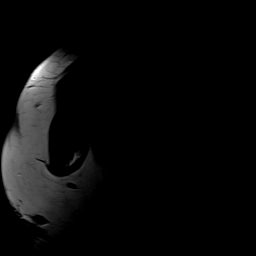
[im 19/19]
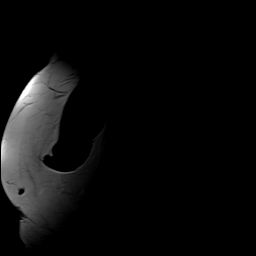

[Series 8: T1 · oblique · 4.0mm · 0.62mm/px · 8 of 19 slices shown]
[im 1/19]
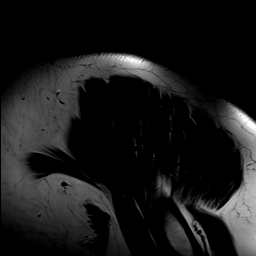
[im 3/19]
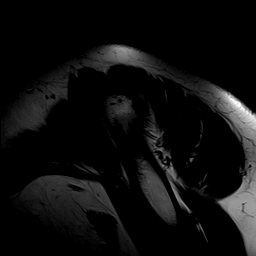
[im 6/19]
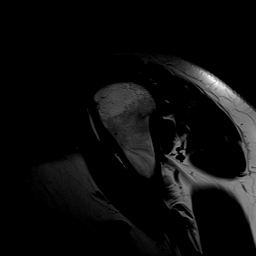
[im 8/19]
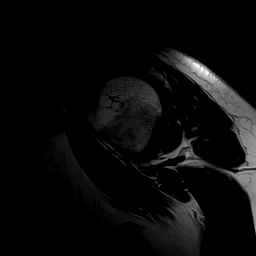
[im 11/19]
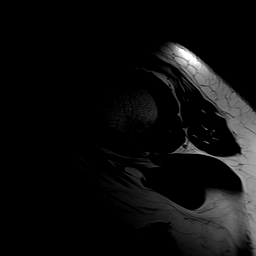
[im 13/19]
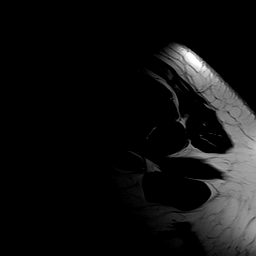
[im 16/19]
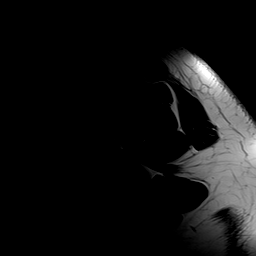
[im 19/19]
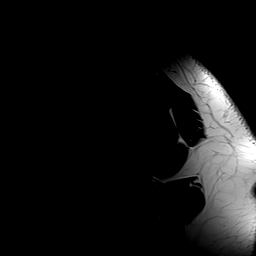

[Series 9: T2 fat-sat · oblique · 4.0mm · 0.62mm/px · 8 of 19 slices shown (3 of 3)]
[im 1/19]
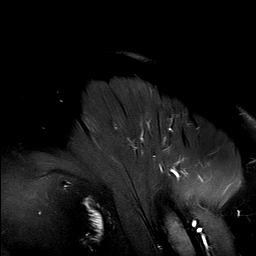
[im 3/19]
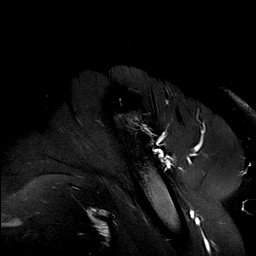
[im 6/19]
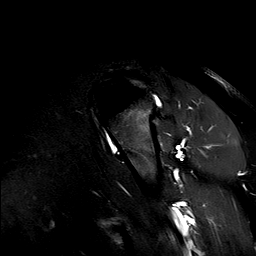
[im 8/19]
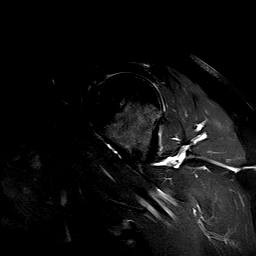
[im 11/19]
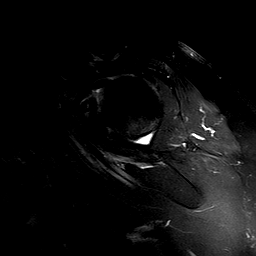
[im 13/19]
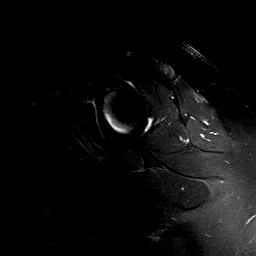
[im 16/19]
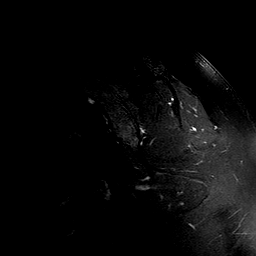
[im 19/19]
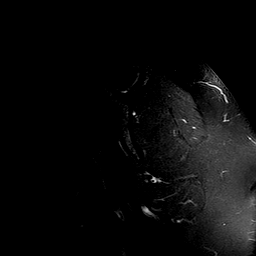

[40 of 40 positions shown; findings below may reference images not displayed]

FINDINGS: Rotator cuff: Mild supraspinatus tendinosis. No evidence of rotator
cuff tear. The infraspinatus, subscapularis and teres minor tendons
appear normal.

Muscles:  No focal muscular atrophy or edema.

Biceps long head:  Intact and normally positioned.

Acromioclavicular Joint: The acromion is type 2. There are mild
acromioclavicular degenerative changes. No significant fluid is
present in the subacromial - subdeltoid bursa.

Glenohumeral Joint: No significant shoulder joint effusion or
glenohumeral arthropathy.

Labrum:  No evidence of labral tear.

Bones: No acute or significant extra-articular osseous findings.

Other: There is tubular T2 hyperintensity within the subcutaneous
tissues of the anterior axilla. This may relate to apocrine gland
hyperplasia or an underlying superficial vascular malformation.
IMPRESSION: 1. Mild supraspinatus tendinosis.  No evidence of rotator cuff tear.
2. The labrum and biceps tendon appear intact.
3. Subcutaneous T2 hyperintensity anteriorly in the right axilla,
likely apocrine gland hyperplasia or an underlying superficial
vascular malformation.
